# Patient Record
Sex: Female | Born: 1955 | Race: White | Hispanic: No | Marital: Married | Smoking: Former smoker
Health system: Southern US, Community
[De-identification: ages and names within clinical notes are randomized; demographics above are authoritative.]

## PROBLEM LIST (undated history)

## (undated) DIAGNOSIS — I10 Essential (primary) hypertension: Secondary | ICD-10-CM

## (undated) DIAGNOSIS — M810 Age-related osteoporosis without current pathological fracture: Secondary | ICD-10-CM

## (undated) DIAGNOSIS — E785 Hyperlipidemia, unspecified: Secondary | ICD-10-CM

## (undated) DIAGNOSIS — C50919 Malignant neoplasm of unspecified site of unspecified female breast: Secondary | ICD-10-CM

## (undated) DIAGNOSIS — J449 Chronic obstructive pulmonary disease, unspecified: Secondary | ICD-10-CM

## (undated) DIAGNOSIS — C801 Malignant (primary) neoplasm, unspecified: Secondary | ICD-10-CM

## (undated) HISTORY — DX: Malignant neoplasm of unspecified site of unspecified female breast: C50.919

## (undated) HISTORY — DX: Age-related osteoporosis without current pathological fracture: M81.0

## (undated) HISTORY — PX: BREAST LUMPECTOMY: SHX2

## (undated) HISTORY — DX: Essential (primary) hypertension: I10

## (undated) HISTORY — PX: FRACTURE SURGERY: SHX138

## (undated) HISTORY — DX: Malignant (primary) neoplasm, unspecified: C80.1

## (undated) HISTORY — DX: Chronic obstructive pulmonary disease, unspecified: J44.9

## (undated) HISTORY — DX: Hyperlipidemia, unspecified: E78.5

---

## 1998-02-24 ENCOUNTER — Ambulatory Visit (HOSPITAL_COMMUNITY): Admission: RE | Admit: 1998-02-24 | Discharge: 1998-02-24 | Payer: Self-pay | Admitting: Obstetrics & Gynecology

## 1999-03-16 ENCOUNTER — Encounter: Payer: Self-pay | Admitting: Obstetrics & Gynecology

## 1999-03-16 ENCOUNTER — Ambulatory Visit (HOSPITAL_COMMUNITY): Admission: RE | Admit: 1999-03-16 | Discharge: 1999-03-16 | Payer: Self-pay | Admitting: Obstetrics & Gynecology

## 1999-08-07 ENCOUNTER — Encounter: Admission: RE | Admit: 1999-08-07 | Discharge: 1999-08-07 | Payer: Self-pay | Admitting: Family Medicine

## 1999-08-07 ENCOUNTER — Encounter: Payer: Self-pay | Admitting: Family Medicine

## 1999-11-02 ENCOUNTER — Encounter: Payer: Self-pay | Admitting: Family Medicine

## 1999-11-02 ENCOUNTER — Encounter: Admission: RE | Admit: 1999-11-02 | Discharge: 1999-11-02 | Payer: Self-pay | Admitting: Family Medicine

## 1999-11-03 ENCOUNTER — Ambulatory Visit (HOSPITAL_BASED_OUTPATIENT_CLINIC_OR_DEPARTMENT_OTHER): Admission: RE | Admit: 1999-11-03 | Discharge: 1999-11-03 | Payer: Self-pay | Admitting: Orthopedic Surgery

## 2000-02-02 HISTORY — PX: CYSTOSCOPY: SUR368

## 2000-03-25 ENCOUNTER — Ambulatory Visit (HOSPITAL_COMMUNITY): Admission: RE | Admit: 2000-03-25 | Discharge: 2000-03-25 | Payer: Self-pay | Admitting: Obstetrics & Gynecology

## 2000-03-25 ENCOUNTER — Encounter: Payer: Self-pay | Admitting: Obstetrics & Gynecology

## 2001-01-27 ENCOUNTER — Other Ambulatory Visit: Admission: RE | Admit: 2001-01-27 | Discharge: 2001-01-27 | Payer: Self-pay | Admitting: Obstetrics & Gynecology

## 2001-02-27 DIAGNOSIS — M949 Disorder of cartilage, unspecified: Secondary | ICD-10-CM

## 2001-02-27 DIAGNOSIS — M899 Disorder of bone, unspecified: Secondary | ICD-10-CM

## 2001-03-27 ENCOUNTER — Encounter: Payer: Self-pay | Admitting: Obstetrics & Gynecology

## 2001-03-27 ENCOUNTER — Ambulatory Visit (HOSPITAL_COMMUNITY): Admission: RE | Admit: 2001-03-27 | Discharge: 2001-03-27 | Payer: Self-pay | Admitting: Obstetrics & Gynecology

## 2001-08-03 ENCOUNTER — Encounter: Payer: Self-pay | Admitting: Urology

## 2001-08-03 ENCOUNTER — Encounter: Admission: RE | Admit: 2001-08-03 | Discharge: 2001-08-03 | Payer: Self-pay | Admitting: Urology

## 2002-02-21 ENCOUNTER — Other Ambulatory Visit: Admission: RE | Admit: 2002-02-21 | Discharge: 2002-02-21 | Payer: Self-pay | Admitting: Obstetrics & Gynecology

## 2002-11-03 ENCOUNTER — Encounter (INDEPENDENT_AMBULATORY_CARE_PROVIDER_SITE_OTHER): Payer: Self-pay | Admitting: Internal Medicine

## 2002-11-03 LAB — CONVERTED CEMR LAB

## 2003-05-07 ENCOUNTER — Other Ambulatory Visit: Admission: RE | Admit: 2003-05-07 | Discharge: 2003-05-07 | Payer: Self-pay | Admitting: *Deleted

## 2004-02-19 ENCOUNTER — Encounter: Admission: RE | Admit: 2004-02-19 | Discharge: 2004-02-19 | Payer: Self-pay | Admitting: Obstetrics & Gynecology

## 2004-05-08 ENCOUNTER — Encounter: Admission: RE | Admit: 2004-05-08 | Discharge: 2004-05-08 | Payer: Self-pay | Admitting: Obstetrics & Gynecology

## 2004-05-15 ENCOUNTER — Encounter (INDEPENDENT_AMBULATORY_CARE_PROVIDER_SITE_OTHER): Payer: Self-pay | Admitting: *Deleted

## 2004-05-15 ENCOUNTER — Encounter: Admission: RE | Admit: 2004-05-15 | Discharge: 2004-05-15 | Payer: Self-pay | Admitting: Obstetrics & Gynecology

## 2004-05-15 ENCOUNTER — Encounter (INDEPENDENT_AMBULATORY_CARE_PROVIDER_SITE_OTHER): Payer: Self-pay | Admitting: Radiology

## 2004-05-19 ENCOUNTER — Ambulatory Visit: Payer: Self-pay | Admitting: Oncology

## 2004-05-22 ENCOUNTER — Other Ambulatory Visit: Admission: RE | Admit: 2004-05-22 | Discharge: 2004-05-22 | Payer: Self-pay | Admitting: Obstetrics & Gynecology

## 2004-05-22 ENCOUNTER — Encounter: Admission: RE | Admit: 2004-05-22 | Discharge: 2004-05-22 | Payer: Self-pay | Admitting: Surgery

## 2004-08-24 ENCOUNTER — Ambulatory Visit: Admission: RE | Admit: 2004-08-24 | Discharge: 2004-11-22 | Payer: Self-pay | Admitting: Radiation Oncology

## 2004-11-23 ENCOUNTER — Ambulatory Visit: Admission: RE | Admit: 2004-11-23 | Discharge: 2004-12-09 | Payer: Self-pay | Admitting: Radiation Oncology

## 2005-01-13 ENCOUNTER — Ambulatory Visit: Admission: RE | Admit: 2005-01-13 | Discharge: 2005-01-13 | Payer: Self-pay | Admitting: Radiation Oncology

## 2005-05-24 ENCOUNTER — Ambulatory Visit: Payer: Self-pay | Admitting: Family Medicine

## 2007-02-21 ENCOUNTER — Ambulatory Visit: Payer: Self-pay | Admitting: Family Medicine

## 2007-09-05 ENCOUNTER — Telehealth: Payer: Self-pay | Admitting: Family Medicine

## 2007-11-03 LAB — CONVERTED CEMR LAB: Pap Smear: NORMAL

## 2008-04-22 ENCOUNTER — Ambulatory Visit: Payer: Self-pay | Admitting: Family Medicine

## 2008-04-22 LAB — CONVERTED CEMR LAB
Bilirubin Urine: NEGATIVE
Glucose, Urine, Semiquant: NEGATIVE
Ketones, urine, test strip: NEGATIVE
Nitrite: NEGATIVE
Protein, U semiquant: NEGATIVE
Specific Gravity, Urine: 1.01
Urobilinogen, UA: 0.2
WBC Urine, dipstick: NEGATIVE
pH: 6.5

## 2008-04-23 ENCOUNTER — Encounter: Payer: Self-pay | Admitting: Family Medicine

## 2008-05-05 HISTORY — PX: BASAL CELL CARCINOMA EXCISION: SHX1214

## 2008-06-18 ENCOUNTER — Telehealth (INDEPENDENT_AMBULATORY_CARE_PROVIDER_SITE_OTHER): Payer: Self-pay | Admitting: Internal Medicine

## 2008-06-19 ENCOUNTER — Encounter (INDEPENDENT_AMBULATORY_CARE_PROVIDER_SITE_OTHER): Payer: Self-pay | Admitting: Internal Medicine

## 2008-06-19 DIAGNOSIS — Q892 Congenital malformations of other endocrine glands: Secondary | ICD-10-CM

## 2008-06-19 DIAGNOSIS — Z853 Personal history of malignant neoplasm of breast: Secondary | ICD-10-CM

## 2008-07-26 ENCOUNTER — Ambulatory Visit: Payer: Self-pay | Admitting: Internal Medicine

## 2008-07-30 LAB — CONVERTED CEMR LAB
ALT: 21 units/L (ref 0–35)
AST: 25 units/L (ref 0–37)
Albumin: 4.1 g/dL (ref 3.5–5.2)
Alkaline Phosphatase: 57 units/L (ref 39–117)
BUN: 12 mg/dL (ref 6–23)
Basophils Absolute: 0 10*3/uL (ref 0.0–0.1)
Basophils Relative: 0.4 % (ref 0.0–3.0)
Bilirubin, Direct: 0.1 mg/dL (ref 0.0–0.3)
CO2: 29 meq/L (ref 19–32)
Calcium: 9.3 mg/dL (ref 8.4–10.5)
Chloride: 105 meq/L (ref 96–112)
Cholesterol: 158 mg/dL (ref 0–200)
Creatinine, Ser: 0.8 mg/dL (ref 0.4–1.2)
Eosinophils Absolute: 0 10*3/uL (ref 0.0–0.7)
Eosinophils Relative: 0.9 % (ref 0.0–5.0)
GFR calc Af Amer: 97 mL/min
GFR calc non Af Amer: 80 mL/min
Glucose, Bld: 91 mg/dL (ref 70–99)
HCT: 40.9 % (ref 36.0–46.0)
HDL: 65.8 mg/dL (ref >39.0)
Hemoglobin: 14.1 g/dL (ref 12.0–15.0)
LDL Cholesterol: 78 mg/dL (ref 0–99)
Lymphocytes Relative: 26.4 % (ref 12.0–46.0)
MCHC: 34.5 g/dL (ref 30.0–36.0)
MCV: 92.5 fL (ref 78.0–100.0)
Monocytes Absolute: 0.3 10*3/uL (ref 0.1–1.0)
Monocytes Relative: 5.4 % (ref 3.0–12.0)
Neutro Abs: 3.4 10*3/uL (ref 1.4–7.7)
Neutrophils Relative %: 66.9 % (ref 43.0–77.0)
Platelets: 197 10*3/uL (ref 150–400)
Potassium: 3.7 meq/L (ref 3.5–5.1)
RBC: 4.42 M/uL (ref 3.87–5.11)
RDW: 11.9 % (ref 11.5–14.6)
Sodium: 140 meq/L (ref 135–145)
TSH: 0.93 microintl units/mL (ref 0.35–5.50)
Total Bilirubin: 1.1 mg/dL (ref 0.3–1.2)
Total CHOL/HDL Ratio: 2.4
Total Protein: 6.8 g/dL (ref 6.0–8.3)
Triglycerides: 69 mg/dL (ref 0–149)
VLDL: 14 mg/dL (ref 0–40)
Vit D, 1,25-Dihydroxy: 34 (ref 30–89)
WBC: 5 10*3/uL (ref 4.5–10.5)

## 2008-08-02 ENCOUNTER — Ambulatory Visit: Payer: Self-pay | Admitting: Family Medicine

## 2008-08-02 ENCOUNTER — Encounter (INDEPENDENT_AMBULATORY_CARE_PROVIDER_SITE_OTHER): Payer: Self-pay | Admitting: Internal Medicine

## 2008-08-02 DIAGNOSIS — E559 Vitamin D deficiency, unspecified: Secondary | ICD-10-CM | POA: Insufficient documentation

## 2008-08-02 DIAGNOSIS — A6000 Herpesviral infection of urogenital system, unspecified: Secondary | ICD-10-CM | POA: Insufficient documentation

## 2008-08-02 DIAGNOSIS — R030 Elevated blood-pressure reading, without diagnosis of hypertension: Secondary | ICD-10-CM

## 2008-08-06 LAB — CONVERTED CEMR LAB: Herpes Simplex Vrs I&II-IgM Ab (EIA): 0.87

## 2008-09-06 ENCOUNTER — Ambulatory Visit: Payer: Self-pay | Admitting: Family Medicine

## 2008-09-06 DIAGNOSIS — I10 Essential (primary) hypertension: Secondary | ICD-10-CM | POA: Insufficient documentation

## 2008-10-08 ENCOUNTER — Encounter: Payer: Self-pay | Admitting: Family Medicine

## 2008-10-11 ENCOUNTER — Ambulatory Visit: Payer: Self-pay | Admitting: Family Medicine

## 2008-10-11 DIAGNOSIS — J309 Allergic rhinitis, unspecified: Secondary | ICD-10-CM | POA: Insufficient documentation

## 2008-10-28 ENCOUNTER — Ambulatory Visit: Payer: Self-pay | Admitting: Family Medicine

## 2008-10-28 DIAGNOSIS — M79609 Pain in unspecified limb: Secondary | ICD-10-CM

## 2008-10-28 DIAGNOSIS — H60339 Swimmer's ear, unspecified ear: Secondary | ICD-10-CM

## 2008-10-28 DIAGNOSIS — L989 Disorder of the skin and subcutaneous tissue, unspecified: Secondary | ICD-10-CM | POA: Insufficient documentation

## 2008-10-28 DIAGNOSIS — S92919A Unspecified fracture of unspecified toe(s), initial encounter for closed fracture: Secondary | ICD-10-CM | POA: Insufficient documentation

## 2008-12-06 ENCOUNTER — Ambulatory Visit: Payer: Self-pay | Admitting: Family Medicine

## 2008-12-06 DIAGNOSIS — R059 Cough, unspecified: Secondary | ICD-10-CM | POA: Insufficient documentation

## 2008-12-06 DIAGNOSIS — R05 Cough: Secondary | ICD-10-CM | POA: Insufficient documentation

## 2008-12-17 ENCOUNTER — Telehealth (INDEPENDENT_AMBULATORY_CARE_PROVIDER_SITE_OTHER): Payer: Self-pay | Admitting: *Deleted

## 2009-02-21 ENCOUNTER — Ambulatory Visit: Payer: Self-pay | Admitting: Family Medicine

## 2009-08-13 LAB — CONVERTED CEMR LAB: Pap Smear: NORMAL

## 2009-09-12 ENCOUNTER — Ambulatory Visit: Payer: Self-pay | Admitting: Family Medicine

## 2009-10-09 ENCOUNTER — Telehealth: Payer: Self-pay | Admitting: Family Medicine

## 2009-10-15 ENCOUNTER — Telehealth: Payer: Self-pay | Admitting: Family Medicine

## 2010-04-28 ENCOUNTER — Ambulatory Visit: Payer: Self-pay | Admitting: Family Medicine

## 2010-04-28 LAB — CONVERTED CEMR LAB: Vit D, 25-Hydroxy: 92 ng/mL — ABNORMAL HIGH (ref 30–89)

## 2010-04-29 ENCOUNTER — Encounter: Payer: Self-pay | Admitting: Family Medicine

## 2010-04-29 LAB — CONVERTED CEMR LAB
BUN: 16 mg/dL (ref 6–23)
CO2: 31 meq/L (ref 19–32)
Calcium: 9.5 mg/dL (ref 8.4–10.5)
Chloride: 101 meq/L (ref 96–112)
Cholesterol: 211 mg/dL — ABNORMAL HIGH (ref 0–200)
Creatinine, Ser: 0.9 mg/dL (ref 0.4–1.2)
Direct LDL: 114.6 mg/dL
GFR calc non Af Amer: 66.9 mL/min (ref >60)
Glucose, Bld: 88 mg/dL (ref 70–99)
HDL: 65.1 mg/dL (ref >39.00)
Potassium: 3.6 meq/L (ref 3.5–5.1)
Sodium: 140 meq/L (ref 135–145)
Total CHOL/HDL Ratio: 3
Triglycerides: 127 mg/dL (ref 0.0–149.0)
VLDL: 25.4 mg/dL (ref 0.0–40.0)

## 2010-05-11 ENCOUNTER — Ambulatory Visit: Payer: Self-pay | Admitting: Psychology

## 2010-06-01 ENCOUNTER — Ambulatory Visit: Payer: Self-pay | Admitting: Psychology

## 2010-06-25 ENCOUNTER — Ambulatory Visit: Payer: Self-pay | Admitting: Family Medicine

## 2010-06-25 ENCOUNTER — Ambulatory Visit: Payer: Self-pay | Admitting: Internal Medicine

## 2010-06-25 DIAGNOSIS — N926 Irregular menstruation, unspecified: Secondary | ICD-10-CM | POA: Insufficient documentation

## 2010-06-25 LAB — CONVERTED CEMR LAB
Bilirubin Urine: NEGATIVE
Glucose, Urine, Semiquant: NEGATIVE
Ketones, urine, test strip: NEGATIVE
Nitrite: NEGATIVE
Specific Gravity, Urine: 1.01
Urobilinogen, UA: 0.2
WBC Urine, dipstick: NEGATIVE
pH: 6

## 2010-06-26 ENCOUNTER — Encounter: Payer: Self-pay | Admitting: Family Medicine

## 2010-08-06 NOTE — Assessment & Plan Note (Signed)
Summary: cpx/alc  wants labs same day   Vital Signs:  Patient profile:   55 year old female Height:      62 inches Weight:      136 pounds BMI:     24.96 Temp:     98.3 degrees F oral Pulse rate:   72 / minute Pulse rhythm:   regular BP sitting:   120 / 80  (right arm) Cuff size:   regular  Vitals Entered By: Linde Gillis CMA [facility]) (April 28, 2010 8:24 AM) CC: complete physicial, no pap, no flu shot   History of Present Illness: 55 yo here for CPX.  Breast CA s/p radiation, tamoxifen. Doing well.  Stopped taking Tamoxifen in June and released by her onocologist last month. UTD on mammogram.  HTN- much improved on HCTZ 25 mg daily.  No CP, SOB, blurred vision or dizziness.  No LE edema.  Well woman- UTD on prevention Has OBGYN.  Current Medications (verified): 1)  Tamoxifen Citrate 20 Mg  Tabs (Tamoxifen Citrate) .... One By Mouth Once A Day 2)  Calcium-D 600-200 Mg-Unit Caps (Calcium Carbonate-Vitamin D) .... Take One By Mouth Daily 3)  Multivitamins  Tabs (Multiple Vitamin) .... Take One By Mouth Daily 4)  Vitamin D 1000 Unit Caps (Cholecalciferol) .Marland Kitchen.. 1daily By Mouth 5)  Hydrochlorothiazide 25 Mg  Tabs (Hydrochlorothiazide) .... Take 1 Tab By Mouth Every Morning  Allergies (verified): No Known Drug Allergies  Past History:  Past Medical History: Last updated: 12/06/2008 Breast cancer, hx of HTN  Past Surgical History: Last updated: 08/02/2008 Dexa Osteopenia/ spine-- 02/07/01 Renal U/S-- wnl-- 08/07/99 Cystoscopy mild urethral trigonitis-- 02/02/00 removal of basal cell L eyelid 11/09 colonoscopy --5/09--  Family History: Last updated: 08/02/2008 Father: Alive, with a heart attack at age 30,  high blood pressure Mother: Alive with Grave's disease, HTN and alcoholism Siblings: Sister died at age 39 of breast cancer, dx at age 38.               One sister alive--HBP 07/2008 Paternal grandparents both had heart attacks and died from them. Paternal  grandmother, went on to renal failure Maternal grandmother had breast cancer--age 2 CV + Father, PGF deceased at 35, MGF MI HBP + Bother parents DM - none Breast cancer + sister and MGM ETOH + Mother Stroke - none  Social History: Last updated: 06/19/2008 Marital Status: Married Children: One Occupation: Emergency planning/management officer for [employer]  Risk Factors: Alcohol Use: <1 (08/02/2008) Caffeine Use: 3 (08/02/2008) Exercise: yes (08/02/2008)  Risk Factors: Smoking Status: quit (06/19/2008) Passive Smoke Exposure: yes (08/02/2008)  Review of Systems      See HPI General:  Denies malaise. Eyes:  Denies blurring. ENT:  Denies difficulty swallowing. CV:  Denies chest pain or discomfort. Resp:  Denies shortness of breath. GI:  Denies abdominal pain, bloody stools, and change in bowel habits. GU:  Denies abnormal vaginal bleeding. MS:  Denies joint pain, joint redness, and joint swelling. Neuro:  Denies headaches. Psych:  Denies anxiety and depression. Endo:  Denies cold intolerance and heat intolerance. Heme:  Denies abnormal bruising.  Physical Exam  General:  Well-developed,well-nourished,in no acute distress; alert,appropriate and cooperative throughout examination Head:  Normocephalic and atraumatic without obvious abnormalities. No apparent alopecia or balding. Eyes:  No corneal or conjunctival inflammation noted. EOMI. Perrla. Funduscopic exam benign, without hemorrhages, exudates or papilledema. Vision grossly normal. Ears:  no external deformities.   Nose:  no external deformity.   Mouth:  good dentition.  Neck:  No deformities, masses, or tenderness noted. Lungs:  Normal respiratory effort, chest expands symmetrically. Lungs are clear to auscultation, no crackles or wheezes. Heart:  Normal rate and regular rhythm. S1 and S2 normal without gallop, murmur, click, rub or other extra sounds. Abdomen:  Bowel sounds positive,abdomen soft and non-tender without masses, organomegaly  or hernias noted. Msk:  No deformity or scoliosis noted of thoracic or lumbar spine.   Extremities:  no edema Neurologic:  alert & oriented X3 and gait normal.   Skin:  Intact without suspicious lesions or rashes Psych:  Cognition and judgment appear intact. Alert and cooperative with normal attention span and concentration. No apparent delusions, illusions, hallucinations   Impression & Recommendations:  Problem # 1:  ROUTINE GENERAL MEDICAL EXAM@[email or web address]  CARE FACL (ICD-V70.0) Reviewed preventive care protocols, scheduled due services, and updated immunizations Discussed nutrition, exercise, diet, and healthy lifestyle.  Doing well. FLP, BMET today. UTD on all other prevention.  Problem # 2:  HYPERTENSION (ICD-401.9) Assessment: Improved Continue HCTZ 25 mg daily. Her updated medication list for this problem includes:    Hydrochlorothiazide 25 Mg Tabs (Hydrochlorothiazide) .Marland Kitchen... Take 1 tab by mouth every morning  Orders: TLB-BMP (Basic Metabolic Panel-BMET) (80048-METABOL)  Complete Medication List: 1)  Tamoxifen Citrate 20 Mg Tabs (Tamoxifen citrate) .... One by mouth once a day 2)  Calcium-d 600-200 Mg-unit Caps (Calcium carbonate-vitamin d) .... Take one by mouth daily 3)  Multivitamins Tabs (Multiple vitamin) .... Take one by mouth daily 4)  Vitamin D 1000 Unit Caps (Cholecalciferol) .Marland Kitchen.. 1daily by mouth 5)  Hydrochlorothiazide 25 Mg Tabs (Hydrochlorothiazide) .... Take 1 tab by mouth every morning  Other Orders: Venipuncture (09811) TLB-Lipid Panel (80061-LIPID) T-Vitamin D (25-Hydroxy) (91478-29562)   Orders Added: 1)  Venipuncture [13086] 2)  TLB-Lipid Panel [80061-LIPID] 3)  TLB-BMP (Basic Metabolic Panel-BMET) [80048-METABOL] 4)  T-Vitamin D (25-Hydroxy) [57846-96295] 5)  Est. Patient 40-64 years [28413]    Current Allergies (reviewed today): No known allergies   Last PAP:  normal (11/03/2007 1:28:08 PM) PAP Result Date:  08/13/2009 PAP Result:   normal Last Mammogram:  Done (09/03/2002 4:49:09 PM) Mammogram Result Date:  08/13/2009 Mammogram Result:  normal

## 2010-08-06 NOTE — Progress Notes (Signed)
Summary: regarding BP  Phone Note Call from Patient   Caller: Patient Call For: Ruthe Mannan MD Summary of Call: Pt called to report BP readings- 150/83, 161/83, 138/87.  She says this is not coming down, she has been getting headaches.  She is taking two hctz daily now.  She left a message and when I called her back to get more info I got her voice mail. Initial call taken by: Lowella Petties CMA,  October 09, 2009 3:21 PM  Follow-up for Phone Call        Yes I would agree that she needs to take two daily.  If still this high taking two, needs to come in to be seen. Follow-up by: Ruthe Mannan MD,  October 09, 2009 3:27 PM  Additional Follow-up for Phone Call Additional follow up Details #1::        Left message on voicemail  in detail.  Personalized VM.  Additional Follow-up by: Delilah Shan CMA [facility]),  October 09, 2009 4:09 PM

## 2010-08-06 NOTE — Letter (Signed)
Summary: Generic Letter  [facility]  [address] [place], [place] [address]   Phone: [phone]  Fax: [phone]    04/29/2010  Kristen Soto [address] [place], [place]  [address]  Dear [name],   We have received your lab results and Dr. Dayton Martes says that your labs are excellent but your Vitamin D is very high.  Please stop taking Vitamin D supplementation (ok to continue calcium with vitamin d tablets). Enclosed is a copy of your lab results.        Sincerely,       Linde Gillis CMA (AAMA)for Dr. Ruthe Mannan

## 2010-08-06 NOTE — Assessment & Plan Note (Signed)
Summary: UTI?  CYD   Vital Signs:  Patient profile:   55 year old female Height:      62 inches Weight:      140.75 pounds BMI:     25.84 Temp:     98.7 degrees F oral Pulse rate:   72 / minute Pulse rhythm:   regular BP sitting:   118 / 76  (left arm) Cuff size:   regular  Vitals Entered By: Lewanda Rife LPN (June 25, 2010 12:41 PM) CC: ?UTI burn when urinate, finishing menstrual cycle light bleeding   History of Present Illness: CC: UTI?  4-5 d h/o dysuria.  + urgency, polyuria, incomplete emptying.  No sxs today.  h/o UTIs in past ,but not recently.  Pushing fluids.  No fevers/chills, no abd pain, n/v, back pain.  No periods for 6 years (BRCA chemo then tamoxifen).  Stopped tamoxifen in June.  1st period 2 wkends ago - heavy bleeding initially then slowed down, then another episode of bleeding this week, light now.  both times bleeding after sex.  no dyspareunia  s/p BRCA treatment.  doing well from this aspect.  Followed by Dr. Avis Epley at [facility].  On SOFT trial.  last well woman exam was 08/2009.  due coming in February or March.  strong family history of ovarian cancer.  Current Medications (verified): 1)  Calcium-D 600-200 Mg-Unit Caps (Calcium Carbonate-Vitamin D) .... Take One By Mouth Daily 2)  Multivitamins  Tabs (Multiple Vitamin) .... Take One By Mouth Daily 3)  Vitamin D 1000 Unit Caps (Cholecalciferol) .Marland Kitchen.. 1daily By Mouth Three Times A Week 4)  Hydrochlorothiazide 25 Mg  Tabs (Hydrochlorothiazide) .... Take 1 Tab By Mouth Every Morning  Allergies (verified): No Known Drug Allergies  Past History:  Past Medical History: Last updated: 12/06/2008 Breast cancer, hx of HTN  Social History: Last updated: 06/19/2008 Marital Status: Married Children: One Occupation: Emergency planning/management officer for [employer]  Review of Systems       per HPI  Physical Exam  General:  Well-developed,well-nourished,in no acute distress; alert,appropriate and cooperative throughout  examination Abdomen:  Bowel sounds positive,abdomen soft and non-tender without masses, organomegaly or hernias noted.  no CVA tenderness.  + suprapubic pressure.  no masses appreciated Pulses:  2+ rad pulses Extremities:  no edema   Impression & Recommendations:  Problem # 1:  ? of UTI (ICD-599.0) Assessment New UA not consistent with UTI.  very dilute.  sent culture.  Encouraged to push clear liquids, get enough rest, and take acetaminophen as needed. To be seen in 10 days if no improvement, sooner if worse.  Her updated medication list for this problem includes:    Cipro 250 Mg Tabs (Ciprofloxacin hcl) .Marland Kitchen... Take one twice daily for 3 days  Orders: UA Dipstick W/ Micro (manual) (16109) Specimen Handling (99000) T-Culture, Urine ([address]-09811)  Problem # 2:  IRREGULAR MENSES (ICD-626.4) Assessment: New s/p BRCA and tamoxifen treatment, completed 12/2009.  New bleeding last 1-2 wks, advised if continued will need evaluation.  both times after sex.  pt states if continued, will f/u with [facility], due for well woman with them.  advised to call and schedule appt.  Complete Medication List: 1)  Calcium-d 600-200 Mg-unit Caps (Calcium carbonate-vitamin d) .... Take one by mouth daily 2)  Multivitamins Tabs (Multiple vitamin) .... Take one by mouth daily 3)  Vitamin D 1000 Unit Caps (Cholecalciferol) .Marland Kitchen.. 1daily by mouth three times a week 4)  Hydrochlorothiazide 25 Mg Tabs (Hydrochlorothiazide) .... Take  1 tab by mouth every morning 5)  Cipro 250 Mg Tabs (Ciprofloxacin hcl) .... Take one twice daily for 3 days  Patient Instructions: 1)  Doesn't look like infection.  culture sent to be sure. 2)  Push fluids, cranberry juice. 3)  If bleeding continues, would recommend pelvic exam. Prescriptions: CIPRO 250 MG TABS (CIPROFLOXACIN HCL) take one twice daily for 3 days  #6 x 0   Entered and Authorized by:   Eustaquio Boyden  MD   Signed by:   Eustaquio Boyden  MD on 06/25/2010   Method used:    Electronically to        [facility]  [address] [id number] (retail)       [address]       [place], [place]  [address]       Ph: [phone] or [phone]       Fax: [phone]   RxID:   [id number] HYDROCHLOROTHIAZIDE 25 MG  TABS (HYDROCHLOROTHIAZIDE) Take 1 tab by mouth every morning  #90 x 3   Entered and Authorized by:   Eustaquio Boyden  MD   Signed by:   Eustaquio Boyden  MD on 06/25/2010   Method used:   Print then Give to Patient   RxID:   [id number]    Orders Added: 1)  Est. Patient Level III [35573] 2)  UA Dipstick W/ Micro (manual) [81000] 3)  Specimen Handling [99000] 4)  T-Culture, Urine [[id number]]    Current Allergies (reviewed today): No known allergies   Laboratory Results   Urine Tests  Date/Time Received: June 25, 2010 12:43 PM  Date/Time Reported: June 25, 2010 12:43 PM   Routine Urinalysis   Color: yellow Appearance: slight hazy Glucose: negative   (Normal Range: Negative) Bilirubin: negative   (Normal Range: Negative) Ketone: negative   (Normal Range: Negative) Spec. Gravity: 1.010   (Normal Range: 1.003-1.035) Blood: small   (Normal Range: Negative) pH: 6.0   (Normal Range: 5.0-8.0) Protein: trace   (Normal Range: Negative) Urobilinogen: 0.2   (Normal Range: 0-1) Nitrite: negative   (Normal Range: Negative) Leukocyte Esterace: negative   (Normal Range: Negative)    Comments: Pt finishing menstrual period.     Appended Document: UTI?  CYD late entry   Clinical Lists Changes  Observations: Added new observation of COMMENTS: read by .........Eustaquio Boyden  MD  June 25, 2010 5:08 PM  UCx sent. (06/25/2010 17:08) Added new observation of UA YEAST: no (06/25/2010 17:08) Added new observation of CASTS URINE: no (06/25/2010 17:08) Added new observation of URINE CRYSTA: no (06/25/2010 17:08) Added new observation of EPI CELL UR: rare (06/25/2010 17:08) Added new observation of MUCUS URINE: no (06/25/2010  17:08) Added new observation of BACTERIA URN: tr (06/25/2010 17:08) Added new observation of RBCS MICRO U: rare (06/25/2010 17:08) Added new observation of WBCS MICRO U: rare (06/25/2010 17:08)      Laboratory Results   Urine Tests    Urine Microscopic WBC/HPF: rare RBC/HPF: rare Bacteria/HPF: tr Mucous/HPF: no Epithelial/HPF: rare Crystals/HPF: no Casts/LPF: no Yeast/HPF: no    Comments: read by .........Eustaquio Boyden  MD  June 25, 2010 5:08 PM  UCx sent.    Appended Document: UTI?  CYD please notify patient small amt bacteria in culture, sensitive to abx used.  should be feeling better.  Appended Document: UTI?  CYD Patient notified and she is feeling much better.

## 2010-08-06 NOTE — Progress Notes (Signed)
Summary: regarding hctz  Phone Note Call from Patient   Caller: Patient Call For: Ruthe Mannan MD Summary of Call: Pt called to report BP readings- 125/80, 125/75, 116/75, 145/80.  She has been taking 2 hctz 12.5 mg's daily but she is asking if a script for 25 mg's, to take one a day, can be sent to [facility]. Initial call taken by: Lowella Petties CMA,  October 15, 2009 12:24 PM    New/Updated Medications: HYDROCHLOROTHIAZIDE 25 MG  TABS (HYDROCHLOROTHIAZIDE) Take 1 tab by mouth every morning Prescriptions: HYDROCHLOROTHIAZIDE 25 MG  TABS (HYDROCHLOROTHIAZIDE) Take 1 tab by mouth every morning  #90 x 3   Entered and Authorized by:   Ruthe Mannan MD   Signed by:   Ruthe Mannan MD on 10/15/2009   Method used:   Electronically to        [facility]* (mail-order)             ,          Ph: [phone]       Fax: [phone]   RxID:   [id number]

## 2010-08-06 NOTE — Assessment & Plan Note (Signed)
Summary: 30 min appt1 month follow up,Kristen Soto's pt   Vital Signs:  Patient profile:   55 year old female Height:      62 inches Weight:      140.13 pounds BMI:     25.72 Temp:     97.6 degrees F oral Pulse rate:   88 / minute Pulse rhythm:   regular BP sitting:   132 / 72  (left arm) Cuff size:   regular  Vitals Entered By: Delilah Shan CMA [facility]) (September 12, 2009 3:45 PM) CC: 30 minute appointment      1 month follow up   History of Present Illness: 55 yo new to me here for follow up of HTN. Currently taking HCTZ 12.5 mg with no side effects.  Had to stop Lisinopril on 12/06/2008 due to cough.  No CP, SOB, blurred vision.   No LE edema. Was at [facility] office last week and BP was 142/82.     Current Medications (verified): 1)  Tamoxifen Citrate 20 Mg  Tabs (Tamoxifen Citrate) .... One By Mouth Once A Day 2)  Calcium-D 600-200 Mg-Unit Caps (Calcium Carbonate-Vitamin D) .... Take One By Mouth Daily 3)  Multivitamins  Tabs (Multiple Vitamin) .... Take One By Mouth Daily 4)  Vitamin D 1000 Unit Caps (Cholecalciferol) .Marland Kitchen.. 1daily By Mouth 5)  Hydrochlorothiazide 12.5 Mg Caps (Hydrochlorothiazide) .Marland Kitchen.. 1 By Mouth Daily  Allergies (verified): No Known Drug Allergies  Family History: Reviewed history from 08/02/2008 and no changes required. Father: Alive, with a heart attack at age 81,  high blood pressure Mother: Alive with Grave's disease, HTN and alcoholism Siblings: Sister died at age 24 of breast cancer, dx at age 5.               One sister alive--HBP 07/2008 Paternal grandparents both had heart attacks and died from them. Paternal grandmother, went on to renal failure Maternal grandmother had breast cancer--age 23 CV + Father, PGF deceased at 62, MGF MI HBP + Bother parents DM - none Breast cancer + sister and MGM ETOH + Mother Stroke - none  Social History: Reviewed history from 06/19/2008 and no changes required. Marital Status: Married Children: One Occupation:  Emergency planning/management officer for [employer]  Review of Systems      See HPI Eyes:  Denies blurring. ENT:  Denies difficulty swallowing. CV:  Denies chest pain or discomfort.  Physical Exam  General:  Well-developed,well-nourished,in no acute distress; alert,appropriate and cooperative throughout examination Eyes:  No corneal or conjunctival inflammation noted. EOMI. Perrla. Funduscopic exam benign, without hemorrhages, exudates or papilledema. Vision grossly normal. Lungs:  Normal respiratory effort, chest expands symmetrically. Lungs are clear to auscultation, no crackles or wheezes. Heart:  Normal rate and regular rhythm. S1 and S2 normal without gallop, murmur, click, rub or other extra sounds. Extremities:  no edema. Psych:  Cognition and judgment appear intact. Alert and cooperative with normal attention span and concentration. No apparent delusions, illusions, hallucinations   Impression & Recommendations:  Problem # 1:  HYPERTENSION (ICD-401.9) Assessment Unchanged Still at goal.  Given that she had an elevated reading last week, advised to check at home three times weekly for next two weeks and give me a call.  Work has been more stressful lat couple of weeks. Continue current dose of HCTZ. Her updated medication list for this problem includes:    Hydrochlorothiazide 12.5 Mg Caps (Hydrochlorothiazide) .Marland Kitchen... 1 by mouth daily  Complete Medication List: 1)  Tamoxifen Citrate 20 Mg Tabs (Tamoxifen citrate) .Marland KitchenMarland KitchenMarland Kitchen  One by mouth once a day 2)  Calcium-d 600-200 Mg-unit Caps (Calcium carbonate-vitamin d) .... Take one by mouth daily 3)  Multivitamins Tabs (Multiple vitamin) .... Take one by mouth daily 4)  Vitamin D 1000 Unit Caps (Cholecalciferol) .Marland Kitchen.. 1daily by mouth 5)  Hydrochlorothiazide 12.5 Mg Caps (Hydrochlorothiazide) .Marland Kitchen.. 1 by mouth daily  Current Allergies (reviewed today): No known allergies

## 2010-11-05 ENCOUNTER — Encounter: Payer: Self-pay | Admitting: Family Medicine

## 2010-11-05 LAB — HM COLONOSCOPY

## 2010-11-05 LAB — HM MAMMOGRAPHY

## 2010-11-09 ENCOUNTER — Encounter: Payer: Self-pay | Admitting: Family Medicine

## 2010-11-09 ENCOUNTER — Ambulatory Visit (INDEPENDENT_AMBULATORY_CARE_PROVIDER_SITE_OTHER): Payer: 59 | Admitting: Family Medicine

## 2010-11-09 ENCOUNTER — Ambulatory Visit
Admission: RE | Admit: 2010-11-09 | Discharge: 2010-11-09 | Disposition: A | Discharge status: Home / Self Care | Payer: 59 | Source: Ambulatory Visit | Attending: Family Medicine | Admitting: Family Medicine

## 2010-11-09 ENCOUNTER — Other Ambulatory Visit: Payer: Self-pay | Admitting: Family Medicine

## 2010-11-09 VITALS — BP 110/80 | HR 60 | Temp 98.6°F | Ht 62.0 in | Wt 135.1 lb

## 2010-11-09 DIAGNOSIS — R102 Pelvic and perineal pain: Secondary | ICD-10-CM

## 2010-11-09 DIAGNOSIS — R509 Fever, unspecified: Secondary | ICD-10-CM | POA: Insufficient documentation

## 2010-11-09 DIAGNOSIS — N949 Unspecified condition associated with female genital organs and menstrual cycle: Secondary | ICD-10-CM

## 2010-11-09 LAB — CBC WITH DIFFERENTIAL/PLATELET
Basophils Absolute: 0 10*3/uL (ref 0.0–0.1)
Basophils Relative: 0.3 % (ref 0.0–3.0)
Eosinophils Absolute: 0 10*3/uL (ref 0.0–0.7)
Eosinophils Relative: 0.3 % (ref 0.0–5.0)
HCT: 35.2 % — ABNORMAL LOW (ref 36.0–46.0)
Hemoglobin: 12.3 g/dL (ref 12.0–15.0)
Lymphocytes Relative: 10.5 % — ABNORMAL LOW (ref 12.0–46.0)
Lymphs Abs: 0.8 10*3/uL (ref 0.7–4.0)
MCHC: 35 g/dL (ref 30.0–36.0)
MCV: 91.1 fl (ref 78.0–100.0)
Monocytes Absolute: 0.4 10*3/uL (ref 0.1–1.0)
Monocytes Relative: 5.1 % (ref 3.0–12.0)
Neutrophils Relative %: 83.8 % — ABNORMAL HIGH (ref 43.0–77.0)
Platelets: 305 10*3/uL (ref 150.0–400.0)
RBC: 3.86 Mil/uL — ABNORMAL LOW (ref 3.87–5.11)
RDW: 12.5 % (ref 11.5–14.6)

## 2010-11-09 LAB — POCT URINALYSIS DIPSTICK
Bilirubin, UA: NEGATIVE
Glucose, UA: NEGATIVE
Ketones, UA: NEGATIVE
Leukocytes, UA: NEGATIVE
Nitrite, UA: NEGATIVE
Protein, UA: NEGATIVE
Spec Grav, UA: 1.005
pH, UA: 7.5

## 2010-11-09 LAB — HEPATIC FUNCTION PANEL
ALT: 15 U/L (ref 0–35)
AST: 18 U/L (ref 0–37)
Albumin: 3.4 g/dL — ABNORMAL LOW (ref 3.5–5.2)
Alkaline Phosphatase: 76 U/L (ref 39–117)
Bilirubin, Direct: 0.1 mg/dL (ref 0.0–0.3)
Total Bilirubin: 0.6 mg/dL (ref 0.3–1.2)
Total Protein: 6.5 g/dL (ref 6.0–8.3)

## 2010-11-09 LAB — BASIC METABOLIC PANEL
BUN: 12 mg/dL (ref 6–23)
CO2: 28 mEq/L (ref 19–32)
Chloride: 102 mEq/L (ref 96–112)
Potassium: 3.7 mEq/L (ref 3.5–5.1)

## 2010-11-09 MED ORDER — MOXIFLOXACIN HCL 400 MG PO TABS: 400.0000 mg | ORAL_TABLET | Freq: Every day | ORAL | Status: AC

## 2010-11-09 NOTE — Progress Notes (Signed)
55 yo here for fever for almost two weeks.  [place] onset of fever, Tmax 103. Had sharp bilateral low back pain. Went to Urgent care facility there. Does not have records with her but she wrote down what she was given and told by MD. Told her UA and culture were neg for infection. CBC showed elevated white count with left shift.  Given IM rocephin.  Per pt, fevers have decreased but every evening, getting low grade temp around 100. Back pain improved. No dysuria. Still does not feel quite right.  Has h/o breast CA s/p radiation, tamoxifen. Doing well.  Stopped taking Tamoxifen in June and released by her onocologist last month.  Has been having irregular heavy periods. S/p endometrial biopsy several months ago at [facility] that was neg.  UTD on mammogram. Colonoscopy neg approx 4 years ago. No blood in stool.   The PMH, PSH, Social History, Family History, Medications, and allergies have been reviewed in [facility], and have been updated if relevant.  ROS: See HPI Patient reports no vision/ hearing  changes, adenopathy, weight change,  persistant / recurrent hoarseness , swallowing issues, chest pain,palpitations,edema,persistant /recurrent cough, hemoptysis, dyspnea( rest/ exertional/paroxysmal nocturnal), gastrointestinal bleeding(melena, rectal bleeding), abdominal pain, significant heartburn boel changes,GU symptoms(dysuria, hematuria,pyuria, incontinence) ),  syncope, focal weakness, memory loss,numbness & tingling, skin/hair /nail changes,abnormal bruising or bleeding, anxiety,or depression.   Physical Exam BP 110/80  Pulse 60  Temp(Src) 98.6 F (37 C) (Oral)  Ht 5\' 2"  (1.575 m)  Wt 135 lb 1.9 oz (61.29 kg)  BMI 24.71 kg/m2  LMP 10/29/2010  General:  Well-developed,well-nourished,in no acute distress; alert,appropriate and cooperative throughout examination Head:  Normocephalic and atraumatic without obvious abnormalities. No apparent alopecia  or balding. Eyes:  No corneal or conjunctival inflammation noted. EOMI. Perrla. Funduscopic exam benign, without hemorrhages, exudates or papilledema. Vision grossly normal. Ears:  no external deformities.   Nose:  no external deformity.   Mouth:  good dentition.   Neck:  No deformities, masses, or tenderness noted. Lungs:  Normal respiratory effort, chest expands symmetrically. Lungs are clear to auscultation, no crackles or wheezes. Heart:  Normal rate and regular rhythm. S1 and S2 normal without gallop, murmur, click, rub or other extra sounds. Abdomen:  Bowel sounds positive,abdomen soft and non-tender without masses, organomegaly or hernias noted. Msk:  No deformity or scoliosis noted of thoracic or lumbar spine.   Extremities:  no edema Neurologic:  alert & oriented X3 and gait normal.   Skin:  Intact without suspicious lesions or rashes Psych:  Cognition and judgment appear intact. Alert and cooperative with normal attention span and concentration. No apparent delusions, illusions, hallucinations

## 2010-11-09 NOTE — Patient Instructions (Signed)
Please stop by to see Kristen Soto on your way out. 

## 2010-11-09 NOTE — Assessment & Plan Note (Signed)
New. Etiology unknown but differential diagnosis is wide. UA here pos for blood only, will send for culture.  She is having menstrual bleeding but not very heavy today.  Blood could be from menstrual bleeding, ?possibly of kidney stone as well. History is most consistent with pyleonephritis but had neg UA and urine culture at urgent care according to pt.  Awaiting urine culture here. I am also concerned about pelvic pathology given her h/o breast CA s/p tamoxifen.  Will get pelvic U/S and treat empirically for intra pelvic/kidney infection with Avelox. Will also order blood work today: Orders Placed This Encounter  . CBC w/Diff  . Basic Metabolic Panel (BMET)  . Hepatic function panel   IFOB as well.

## 2010-11-10 LAB — URINE CULTURE
Colony Count: NO GROWTH
Organism ID, Bacteria: NO GROWTH

## 2010-11-20 NOTE — Op Note (Signed)
[place]  Patient:    Kristen Soto, Kristen Soto                       MRN: [id number] Proc. Date: 11/03/99 Adm. Date:  [id number] Disc. Date: [id number] Attending:  Sypher, Douglass Rivers CC:         Katy Fitch. Sypher, Montez Hageman., M.D. x 2                           Operative Report  PREOPERATIVE DIAGNOSIS:  Intra-articular fracture, right ring finger proximal phalanx at proximal interphalangeal joint.  POSTOPERATIVE DIAGNOSIS:  Intra-articular fracture, right ring finger proximal phalanx at proximal interphalangeal joint.  OPERATION:  Closed reduction and percutaneous Kirschner wire fixation x 3 of right ring proximal phalangeal intra-articular fracture.  SURGEON:  Katy Fitch. Sypher, Montez Hageman., M.D.  ASSISTANT:  None.  ANESTHESIA:  General LMA.  SUPERVISING ANESTHESIOLOGIST:  Halford Decamp, M.D.  INDICATIONS:  The patient is a 55 year old woman who was wrestling with her dog and accidentally got her finger caught in the collar.  She sustained an ulnar deviation injury to her ring finger, sustaining intra-articular fracture of the proximal phalanx.  She was seen at her family physicians office.  X-rays were obtained and she was referred for a hand surgery consult.  After informed consent, she was brought to the operating room at this time for closed reduction of her displaced fracture and percutaneous K-wire fixation, __  open reduction and internal fixation.  DESCRIPTION OF PROCEDURE:  The patient was brought to the operating room and placed in the supine position upon the operating table.  Following induction of general anesthesia by LMA, the right arm was prepped with Betadine soap and solution, and sterilely draped.  With the aid of the C-arm fluoroscope, the fracture was reduced anatomically with closed reduction with three point molding and use of a towel clip.  Three 0.035 inch Kirschner wires were placed subcutaneously.   Satisfactory reduction was achieved.  Thereafter, the hand was placed in a ________ gauze dressing with individual gauze pads between the fingers, and a derotation wrap around the ring finger to prevent recurrence of the deformity.  Dorsal palmar plaster ________ splints were placed, maintaining the hand in a safe position.  The patient was awakened from anesthesia and transferred to the recovery room with stable vital signs.  She will be discharged to the care of her husband with prescriptions for Motrin 600 mg one p.o. q.6h. p.r.n. pain, also Percocet 5/325 one or two tablets p.o. q.4-6h. p.r.n. pain.  She will return to the office and will follow up in six days for reevaluation x-ray and placement in a dorsal splint. DD:  11/03/99 TD:  11/05/99 Job: [id number]

## 2010-11-25 ENCOUNTER — Encounter: Payer: Self-pay | Admitting: *Deleted

## 2010-11-25 ENCOUNTER — Other Ambulatory Visit: Payer: 59

## 2010-11-25 ENCOUNTER — Other Ambulatory Visit (INDEPENDENT_AMBULATORY_CARE_PROVIDER_SITE_OTHER): Payer: 59 | Admitting: Family Medicine

## 2010-11-25 DIAGNOSIS — Z1211 Encounter for screening for malignant neoplasm of colon: Secondary | ICD-10-CM

## 2010-12-15 ENCOUNTER — Telehealth: Payer: Self-pay | Admitting: *Deleted

## 2010-12-15 NOTE — Telephone Encounter (Signed)
Patient advised as instructed via telephone. 

## 2010-12-15 NOTE — Telephone Encounter (Signed)
Drops do not help unless it is an outer ear infection or if she has a ruptured ear drum. Unfortunately, I do have to examen her in order to call in abx.  Please have her go to urgent care.

## 2010-12-15 NOTE — Telephone Encounter (Signed)
Pt is complaining of having an ear infection- has pain.  Says she had one a couple of years ago.  [place] is at the beach and is asking that antibiotic drops be called in.  I advised pt that she would need to be seen in order to get antibiotics, she [place].  She says she isnt going to do that because it would cost her too much.  Continued to ask for antibiotic drops to be [facility] [place] [facility] [place] [facility].  Please advise.

## 2010-12-21 ENCOUNTER — Ambulatory Visit (INDEPENDENT_AMBULATORY_CARE_PROVIDER_SITE_OTHER): Payer: 59 | Admitting: Family Medicine

## 2010-12-21 ENCOUNTER — Encounter: Payer: Self-pay | Admitting: Family Medicine

## 2010-12-21 VITALS — BP 130/80 | HR 61 | Temp 98.5°F | Ht 62.0 in | Wt 137.8 lb

## 2010-12-21 DIAGNOSIS — H6090 Unspecified otitis externa, unspecified ear: Secondary | ICD-10-CM

## 2010-12-21 DIAGNOSIS — H60399 Other infective otitis externa, unspecified ear: Secondary | ICD-10-CM

## 2010-12-21 MED ORDER — FLUOCINOLONE ACETONIDE 0.01 % OT OIL: TOPICAL_OIL | OTIC | Status: DC

## 2010-12-21 MED ORDER — OFLOXACIN 0.3 % OT SOLN: OTIC | Status: AC

## 2010-12-21 NOTE — Progress Notes (Signed)
Kristen Soto, a 55 y.o. female presents today in the office for the following:   Has been having a lot of swimmer's ear -- when going on vacation will get swimmer's ear.  Used some vinegar and alcohol. As well as peroxide.  R canal pain while on vaca  Dives  The PMH, PSH, Social History, Family History, Medications, and allergies have been reviewed in [facility], and have been updated if relevant.  ROS: GEN: Acute illness details above GI: Tolerating PO intake GU: maintaining adequate hydration and urination Pulm: No SOB Interactive and getting along well at home.  Otherwise, ROS is as per the HPI.   Physical Exam  Blood pressure 130/80, pulse 61, temperature 98.5 F (36.9 C), temperature source Oral, height 5\' 2"  (1.575 m), weight 137 lb 12.8 oz (62.506 kg), SpO2 98.00%.  GEN: WDWN, NAD, Non-toxic, A & O x 3 HEENT: Atraumatic, Normocephalic. Neck supple. No masses, No LAD. R canal inflammed, minimally to nontender to touch Ears and Nose: No external deformity. EXTR: No c/c/e NEURO Normal gait.  PSYCH: Normally interactive. Conversant. Not depressed or anxious appearing.  Calm demeanor.   A/P: OE, recurrent. Now, use steroid only. OK to hold floxin otic if develops while diving in the future. Discussed measures to prevent

## 2011-03-30 ENCOUNTER — Encounter: Payer: Self-pay | Admitting: *Deleted

## 2011-03-30 ENCOUNTER — Encounter: Payer: Self-pay | Admitting: Family Medicine

## 2011-03-30 ENCOUNTER — Ambulatory Visit (INDEPENDENT_AMBULATORY_CARE_PROVIDER_SITE_OTHER): Payer: 59 | Admitting: Family Medicine

## 2011-03-30 ENCOUNTER — Ambulatory Visit (INDEPENDENT_AMBULATORY_CARE_PROVIDER_SITE_OTHER)
Admission: RE | Admit: 2011-03-30 | Discharge: 2011-03-30 | Disposition: A | Discharge status: Home / Self Care | Payer: 59 | Source: Ambulatory Visit | Attending: Family Medicine | Admitting: Family Medicine

## 2011-03-30 VITALS — BP 120/74 | HR 52 | Temp 97.6°F | Wt 137.8 lb

## 2011-03-30 DIAGNOSIS — S060X0A Concussion without loss of consciousness, initial encounter: Secondary | ICD-10-CM

## 2011-03-30 DIAGNOSIS — M542 Cervicalgia: Secondary | ICD-10-CM

## 2011-03-30 MED ORDER — CYCLOBENZAPRINE HCL 10 MG PO TABS: 10.0000 mg | ORAL_TABLET | Freq: Three times a day (TID) | ORAL | Status: AC | PRN
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
## 2011-05-25 ENCOUNTER — Encounter: Payer: Self-pay | Admitting: Family Medicine

## 2011-05-25 ENCOUNTER — Ambulatory Visit (INDEPENDENT_AMBULATORY_CARE_PROVIDER_SITE_OTHER): Payer: 59 | Admitting: Family Medicine

## 2011-05-25 VITALS — BP 140/80 | HR 60 | Temp 97.9°F | Wt 142.8 lb

## 2011-05-25 DIAGNOSIS — R3989 Other symptoms and signs involving the genitourinary system: Secondary | ICD-10-CM

## 2011-05-25 DIAGNOSIS — R350 Frequency of micturition: Secondary | ICD-10-CM

## 2011-05-25 DIAGNOSIS — R399 Unspecified symptoms and signs involving the genitourinary system: Secondary | ICD-10-CM

## 2011-05-25 LAB — POCT URINALYSIS DIPSTICK
Bilirubin, UA: NEGATIVE
Glucose, UA: NEGATIVE
Ketones, UA: NEGATIVE
Leukocytes, UA: NEGATIVE
Nitrite, UA: NEGATIVE

## 2011-05-25 MED ORDER — CIPROFLOXACIN HCL 250 MG PO TABS: 250.0000 mg | ORAL_TABLET | Freq: Two times a day (BID) | ORAL | Status: DC

## 2011-05-25 NOTE — Assessment & Plan Note (Addendum)
UA very dilute with small blood but micro reassuring. Recent intermittent abx use murkies picture.  Sent in UCx. Treat as UTI for 5d with cipro 250mg  given recent abx use. If UCx neg, ?IC. Consider retesting UA for hematuria when pt returns for BP f/u with PCP.

## 2011-05-25 NOTE — Progress Notes (Signed)
  Subjective:    Patient ID: Kristen Soto, female    DOB: [birth year], 55 y.o.   MRN: [id number]  HPI CC: ?UTI  Thinks may have bladder infection.  Sxs going on for 1 mo.  Mild burning twinge with urine.  At night worse - feels like needs to void multiple times and wakes up multiple times.  During day some frequency and urgency.  Drinking water, cranberry juice.  Is prone to UTIs, was given tmp-sulfa by OBGYN.  Has been taking intermittently for last several weeks.  Last took 2-3 weeks ago.  No fevers/chills, back pain, abd pain, n/v.  No hematuria.  Review of Systems Per HPI    Objective:   Physical Exam  Nursing note and vitals reviewed. Constitutional: She appears well-developed and well-nourished. No distress.  HENT:  Head: Normocephalic and atraumatic.  Mouth/Throat: Oropharynx is clear and moist. No oropharyngeal exudate.  Cardiovascular: Normal rate, regular rhythm, normal heart sounds and intact distal pulses.   No murmur heard. Pulmonary/Chest: Effort normal and breath sounds normal. No respiratory distress. She has no wheezes. She has no rales.  Abdominal: Soft. Bowel sounds are normal. She exhibits no distension. There is no tenderness. There is no rebound, no guarding and no CVA tenderness.       Mild suprapubic pressure with palpation  Skin: Skin is warm and dry. No rash noted.       Assessment & Plan:

## 2011-05-25 NOTE — Patient Instructions (Addendum)
Urine overall looking clear.  I've sent in urine for culture. I would like you to take cipro twice daily for 5 days. Please notify us if not improving after treatment. Continue to push fluids and rest.  May use tylenol for discomfort. I would like [place] to recheck urine when you return for physical or blood pressure follow up.

## 2011-05-27 LAB — URINE CULTURE: Colony Count: 35000

## 2011-05-31 ENCOUNTER — Telehealth: Payer: Self-pay | Admitting: Family Medicine

## 2011-05-31 MED ORDER — CIPROFLOXACIN HCL 500 MG PO TABS: 500.0000 mg | ORAL_TABLET | Freq: Two times a day (BID) | ORAL | Status: DC

## 2011-05-31 NOTE — Telephone Encounter (Signed)
Message copied by Eustaquio Boyden on Mon May 31, 2011 10:18 AM ------      Message from: Josph Macho A      Created: Mon May 31, 2011 10:12 AM       Notified patient. She said she finished the cipro and she is still not feeling 100% better. She is still experiencing frequency, but she is not producing much when she goes. She said she took macrobid before and it helped. She requested this Rx if you send in something else.

## 2011-05-31 NOTE — Telephone Encounter (Signed)
Will prolong cipro course for another 5 days for total 10 d course.  cipro better for this bacteria than macrobid.  To notify us if sxs not improved after 10d course.

## 2011-05-31 NOTE — Telephone Encounter (Signed)
Patient notified. She will call back if no better after finishing abx.

## 2011-06-01 ENCOUNTER — Other Ambulatory Visit: Payer: Self-pay | Admitting: Family Medicine

## 2011-06-01 DIAGNOSIS — E559 Vitamin D deficiency, unspecified: Secondary | ICD-10-CM

## 2011-06-01 DIAGNOSIS — Z136 Encounter for screening for cardiovascular disorders: Secondary | ICD-10-CM

## 2011-06-01 DIAGNOSIS — I10 Essential (primary) hypertension: Secondary | ICD-10-CM

## 2011-06-07 ENCOUNTER — Other Ambulatory Visit (INDEPENDENT_AMBULATORY_CARE_PROVIDER_SITE_OTHER): Payer: 59

## 2011-06-07 DIAGNOSIS — Z136 Encounter for screening for cardiovascular disorders: Secondary | ICD-10-CM

## 2011-06-07 DIAGNOSIS — Z8041 Family history of malignant neoplasm of ovary: Secondary | ICD-10-CM

## 2011-06-07 DIAGNOSIS — E559 Vitamin D deficiency, unspecified: Secondary | ICD-10-CM

## 2011-06-07 DIAGNOSIS — R319 Hematuria, unspecified: Secondary | ICD-10-CM

## 2011-06-07 DIAGNOSIS — I10 Essential (primary) hypertension: Secondary | ICD-10-CM

## 2011-06-07 LAB — BASIC METABOLIC PANEL
BUN: 15 mg/dL (ref 6–23)
Chloride: 101 mEq/L (ref 96–112)
GFR: 92.47 mL/min (ref >60.00)
Glucose, Bld: 98 mg/dL (ref 70–99)
Potassium: 3.2 mEq/L — ABNORMAL LOW (ref 3.5–5.1)
Sodium: 138 mEq/L (ref 135–145)

## 2011-06-07 LAB — LIPID PANEL
Cholesterol: 214 mg/dL — ABNORMAL HIGH (ref 0–200)
HDL: 70.3 mg/dL (ref >39.00)
Total CHOL/HDL Ratio: 3
VLDL: 20 mg/dL (ref 0.0–40.0)

## 2011-06-07 LAB — POCT URINALYSIS DIPSTICK
Glucose, UA: NEGATIVE
Leukocytes, UA: NEGATIVE
Nitrite, UA: NEGATIVE
Protein, UA: NEGATIVE
Urobilinogen, UA: 0.2

## 2011-06-07 NOTE — Progress Notes (Signed)
Addended by: Alvina Chou on: 06/07/2011 02:47 PM   Modules accepted: Orders

## 2011-06-07 NOTE — Progress Notes (Signed)
Addended by: Josph Macho A on: 06/07/2011 04:28 PM   Modules accepted: Orders

## 2011-06-07 NOTE — Progress Notes (Signed)
UA collected at lab appointment. Dip + for blood. Sent for microscopy.

## 2011-06-07 NOTE — Progress Notes (Signed)
Addended by: Alvina Chou on: 06/07/2011 03:42 PM   Modules accepted: Orders

## 2011-06-08 ENCOUNTER — Encounter: Payer: Self-pay | Admitting: *Deleted

## 2011-06-08 LAB — URINALYSIS, MICROSCOPIC ONLY: Squamous Epithelial / LPF: NONE SEEN

## 2011-06-08 LAB — LDL CHOLESTEROL, DIRECT: Direct LDL: 117.6 mg/dL

## 2011-06-14 ENCOUNTER — Encounter: Payer: Self-pay | Admitting: Family Medicine

## 2011-06-14 ENCOUNTER — Ambulatory Visit (INDEPENDENT_AMBULATORY_CARE_PROVIDER_SITE_OTHER): Payer: 59 | Admitting: Family Medicine

## 2011-06-14 ENCOUNTER — Encounter: Payer: Self-pay | Admitting: *Deleted

## 2011-06-14 VITALS — BP 122/72 | HR 63 | Temp 98.2°F | Ht 62.0 in | Wt 143.5 lb

## 2011-06-14 DIAGNOSIS — R319 Hematuria, unspecified: Secondary | ICD-10-CM | POA: Insufficient documentation

## 2011-06-14 DIAGNOSIS — R399 Unspecified symptoms and signs involving the genitourinary system: Secondary | ICD-10-CM

## 2011-06-14 DIAGNOSIS — R635 Abnormal weight gain: Secondary | ICD-10-CM

## 2011-06-14 DIAGNOSIS — I10 Essential (primary) hypertension: Secondary | ICD-10-CM

## 2011-06-14 DIAGNOSIS — Z0000 Encounter for general adult medical examination without abnormal findings: Secondary | ICD-10-CM

## 2011-06-14 DIAGNOSIS — R3989 Other symptoms and signs involving the genitourinary system: Secondary | ICD-10-CM

## 2011-06-14 DIAGNOSIS — Z853 Personal history of malignant neoplasm of breast: Secondary | ICD-10-CM

## 2011-06-14 DIAGNOSIS — M899 Disorder of bone, unspecified: Secondary | ICD-10-CM

## 2011-06-14 LAB — POCT URINALYSIS DIPSTICK
Leukocytes, UA: NEGATIVE
Nitrite, UA: NEGATIVE
Protein, UA: NEGATIVE
Urobilinogen, UA: 0.2
pH, UA: 6

## 2011-06-14 LAB — TSH: TSH: 0.8 u[IU]/mL (ref 0.35–5.50)

## 2011-06-14 LAB — T4, FREE: Free T4: 0.78 ng/dL (ref 0.60–1.60)

## 2011-06-14 MED ORDER — SULFAMETHOXAZOLE-TRIMETHOPRIM 800-160 MG PO TABS: 1.0000 | ORAL_TABLET | Freq: Two times a day (BID) | ORAL | Status: AC

## 2011-06-14 MED ORDER — LOSARTAN POTASSIUM 50 MG PO TABS: 50.0000 mg | ORAL_TABLET | Freq: Every day | ORAL | Status: DC

## 2011-06-14 NOTE — Progress Notes (Signed)
55 yo here for CPX.   Breast CA s/p radiation, tamoxifen.  Doing well. Stopped taking Tamoxifen in June 2011 and released by oncology.  UTD on mammogram.   HTN- much improved on HCTZ 25 mg daily. No CP, SOB, blurred vision or dizziness. No LE edema.  Potassium was a little low at 3.2 and pt would like to try another BP medication since she feels she cannot increase potassium in diet and would prefer not to supplement.  Wants to try ACEI again although gave her a cough.  Well woman- UTD on prevention  Has OBGYN.   Weight gain - denies any other symptoms of hypothyroidism but would like her thyroid checked. Admits to not excercising as much.  Saw Dr. Reece Agar last month, treated for UTI with 10 day course of cipro. Still has symptoms- increased urinary frequency and urgency.  Multiple recent UTIs.   See HPI  General: Denies malaise.  Eyes: Denies blurring.  ENT: Denies difficulty swallowing.  CV: Denies chest pain or discomfort.  Resp: Denies shortness of breath.  GI: Denies abdominal pain, bloody stools, and change in bowel habits.  GU: Denies abnormal vaginal bleeding.  MS: Denies joint pain, joint redness, and joint swelling.  Neuro: Denies headaches.  Psych: Denies anxiety and depression.  Endo: Denies cold intolerance and heat intolerance.  Heme: Denies abnormal bruising.   Physical Exam  BP 122/72  Pulse 63  Temp(Src) 98.2 F (36.8 C) (Oral)  Ht 5\' 2"  (1.575 m)  Wt 143 lb 8 oz (65.091 kg)  BMI 26.25 kg/m2 Wt Readings from Last 3 Encounters:  06/14/11 143 lb 8 oz (65.091 kg)  05/25/11 142 lb 12 oz (64.751 kg)  03/30/11 137 lb 12 oz (62.483 kg)    General: Well-developed,well-nourished,in no acute distress; alert,appropriate and cooperative throughout examination  Head: Normocephalic and atraumatic without obvious abnormalities. No apparent alopecia or balding.  Eyes: No corneal or conjunctival inflammation noted. EOMI. Perrla. Funduscopic exam benign, without  hemorrhages, exudates or papilledema. Vision grossly normal.  Ears: no external deformities.  Nose: no external deformity.  Mouth: good dentition.  Neck: No deformities, masses, or tenderness noted.  Lungs: Normal respiratory effort, chest expands symmetrically. Lungs are clear to auscultation, no crackles or wheezes.  Heart: Normal rate and regular rhythm. S1 and S2 normal without gallop, murmur, click, rub or other extra sounds.  Abdomen: Bowel sounds positive,abdomen soft and non-tender without masses, organomegaly or hernias noted.  Msk: No deformity or scoliosis noted of thoracic or lumbar spine.  Extremities: no edema  Neurologic: alert & oriented X3 and gait normal.  Skin: Intact without suspicious lesions or rashes  Psych: Cognition and judgment appear intact. Alert and cooperative with normal attention span and concentration. No apparent delusions, illusions, hallucinations    Assessment and Plan: 1. Routine general medical examination at a health care facility  Reviewed preventive care protocols, scheduled due services, and updated immunizations Discussed nutrition, exercise, diet, and healthy lifestyle.     2. OSTEOPENIA  Per pt, had recent DEXA at [facility]- awaiting records.   3. BREAST CANCER, HX OF     4. HTN Stable but pt would like to try another antihypertensive. Will try losartan- warned of possible cough.   5. Hematuria  Unchanged.  Send urine for cx. Treat with bactrim. Recurrent UTIs with hematuria, refer to urology. The patient indicates understanding of these issues and agrees with the plan.  POCT urinalysis dipstick, Ambulatory referral to Urology  6. Weight gain  Likely due  to decreased activity, will check thyroid function. TSH, T4, free  7. Lower urinary tract symptoms (LUTS)  See above. Ambulatory referral to Urology

## 2011-06-14 NOTE — Patient Instructions (Signed)
Good to see you. Please stop taking your HCTZ and start taking your lostartan 50 mg daily. Please keep me posted with your blood pressure or follow up with [place] in 2 weeks to recheck. I have also sent in Bactrim Ds - 1 tablet twice daily for 5 days and we will send your urine for culture. Please stop by to see Shirlee Limerick to set up your urology referral.

## 2011-06-14 NOTE — Progress Notes (Signed)
Addended by: Gilmer Mor on: 06/14/2011 02:29 PM   Modules accepted: Orders

## 2011-06-16 LAB — URINE CULTURE

## 2011-07-16 ENCOUNTER — Other Ambulatory Visit: Payer: Self-pay | Admitting: *Deleted

## 2011-07-16 MED ORDER — LOSARTAN POTASSIUM 50 MG PO TABS: 50.0000 mg | ORAL_TABLET | Freq: Every day | ORAL | Status: DC

## 2011-07-19 ENCOUNTER — Other Ambulatory Visit: Payer: Self-pay | Admitting: Internal Medicine

## 2011-07-19 MED ORDER — LOSARTAN POTASSIUM 50 MG PO TABS: 50.0000 mg | ORAL_TABLET | Freq: Every day | ORAL | Status: DC

## 2011-07-19 NOTE — Telephone Encounter (Signed)
Patient wanted Rx to go to [facility]. Sent to pharmacy.

## 2011-08-04 ENCOUNTER — Telehealth: Payer: Self-pay | Admitting: Family Medicine

## 2011-08-04 NOTE — Telephone Encounter (Signed)
Pt needs to be evaluated.

## 2011-08-04 NOTE — Telephone Encounter (Signed)
Triage Record Num: [id number] Operator: Albertine Grates Patient Name: Kristen Soto Call Date & Time: 08/03/2011 5:16:42PM Patient Phone: [phone] PCP: Ruthe Mannan Patient Gender: Female PCP Fax : [phone] Patient DOB: 1956-03-07 Practice Name: [facility] Reason for Call: Caller: [name]/Patient; PCP: Ruthe Mannan [facility]); CB#: [phone]; ; ; Call regarding BP 159/88; Has taken BP 1-29 and BP is 159/88. MD changed script in December and has been taking as prescribed. Does not know name of medicine but knows is 50mg . Wants to know if needs to increase amount of med. Denies emergent symptoms. PLEASE CALL AND ADVISE IF WANTS TO CHANGE DOSE OF MEDICINE [phone]. Protocol(s) Used: Hypertension, Diagnosed or Suspected Recommended Outcome per Protocol: See Provider within 24 hours Reason for Outcome: A sudden elevation in blood pressure AND has not been taking blood pressure medication as prescribed, or recently started, changed, or stopped taking prescription medication; or recently started taking nonprescription or alternative medicine Care Advice: Call EMS 911 if new symptoms develop, such as severe shortness of breath, chest pain, change in mental status, acute neurologic deficit, seizure, visual disturbances, pulse rate > 120 / minute, or very irregular pulse. ~ ~ Call provider if systolic BP is 180 or greater, or if diastolic BP is 120 or greater. 08/03/2011 5:21:55PM Page 1 of 1 [email or web address]

## 2011-08-05 ENCOUNTER — Encounter: Payer: Self-pay | Admitting: *Deleted

## 2011-08-05 ENCOUNTER — Ambulatory Visit (INDEPENDENT_AMBULATORY_CARE_PROVIDER_SITE_OTHER): Payer: 59 | Admitting: Family Medicine

## 2011-08-05 ENCOUNTER — Encounter: Payer: Self-pay | Admitting: Family Medicine

## 2011-08-05 VITALS — BP 140/90 | HR 68 | Temp 98.0°F | Wt 141.2 lb

## 2011-08-05 DIAGNOSIS — I10 Essential (primary) hypertension: Secondary | ICD-10-CM

## 2011-08-05 LAB — BASIC METABOLIC PANEL
BUN: 13 mg/dL (ref 6–23)
CO2: 30 mEq/L (ref 19–32)
Calcium: 9.9 mg/dL (ref 8.4–10.5)
Chloride: 103 mEq/L (ref 96–112)
Creatinine, Ser: 0.7 mg/dL (ref 0.4–1.2)
Glucose, Bld: 96 mg/dL (ref 70–99)

## 2011-08-05 MED ORDER — LOSARTAN POTASSIUM 50 MG PO TABS: 75.0000 mg | ORAL_TABLET | Freq: Every day | ORAL | Status: DC

## 2011-08-05 NOTE — Telephone Encounter (Signed)
Patient coming today to see Dr. Dayton Martes at 11:45.

## 2011-08-05 NOTE — Progress Notes (Signed)
56 yo here for follow up HTN.  HTN- was much improved on HCTZ 25 mg daily but potassium a little low and she wanted to try another medication rather than taking potassium supplement. Has ACEI allergy. Started Losartan 50 mg daily last month. BP has been elevated at home to 160s/100s. Using an OMRON home arm cuff. Was elevated at urology office last week to 160/90.  SOB or blurred vision but she has had a headache.  No cough with losartan.  Patient Active Problem List  Diagnoses  . GENITAL HERPES  . VITAMIN D DEFICIENCY  . HYPERTENSION  . ALLERGIC RHINITIS  . SKIN LESION  . TOE PAIN  . OSTEOPENIA  . THYROGLOSSAL DUCT CYST  . STILLBIRTH  . ELEVATED BLOOD PRESSURE WITHOUT DIAGNOSIS OF HYPERTENSION  . FRACTURE, TOE, LEFT  . BREAST CANCER, HX OF  . IRREGULAR MENSES  . Lower urinary tract symptoms (LUTS)  . Routine general medical examination at a health care facility  . Hematuria   Past Medical History  Diagnosis Date  . Cancer     breast  . Hypertension    Past Surgical History  Procedure Date  . Cystoscopy 02/02/2000    mild urethral trigonitis  . Basal cell carcinoma excision 05/2008    removal, left eyelid   History  Substance Use Topics  . Smoking status: Never Smoker   . Smokeless tobacco: Not on file  . Alcohol Use: Not on file   Family History  Problem Relation Age of Onset  . Alcohol abuse Mother   . Hypertension Mother   . Graves' disease Mother   . Heart attack Father 82  . Hypertension Father   . Hypertension Sister   . Heart attack Paternal Grandmother   . Kidney disease Paternal Grandmother   . Cancer Paternal Grandmother 74    breast  . Heart attack Paternal Grandfather   . Cancer Sister 23    breast   Allergies  Allergen Reactions  . Lisinopril Cough   Current Outpatient Prescriptions on File Prior to Visit  Medication Sig Dispense Refill  . Calcium Carb-Cholecalciferol (CALCIUM 500/D) 500-400 MG-UNIT CHEW Chew 1 tablet by mouth  daily.        . Multiple Vitamin (MULTIVITAMIN) capsule Take 1 capsule by mouth daily.         The PMH, PSH, Social History, Family History, Medications, and allergies have been reviewed in [facility], and have been updated if relevant.   ROS: See HPI    Physical Exam  There were no vitals taken for this visit. Wt Readings from Last 3 Encounters:  06/14/11 143 lb 8 oz (65.091 kg)  05/25/11 142 lb 12 oz (64.751 kg)  03/30/11 137 lb 12 oz (62.483 kg)   BP 140/90  Pulse 68  Temp(Src) 98 F (36.7 C) (Oral)  Wt 141 lb 4 oz (64.071 kg) BP Readings from Last 3 Encounters:  08/05/11 140/90  06/14/11 122/72  05/25/11 140/80    General: Well-developed,well-nourished,in no acute distress; alert,appropriate and cooperative throughout examination  Head: Normocephalic and atraumatic without obvious abnormalities. No apparent alopecia or balding.  Eyes: No corneal or conjunctival inflammation noted. EOMI. Perrla. Funduscopic exam benign, without hemorrhages, exudates or papilledema. Vision grossly normal.  Mouth: good dentition.  Neck: No deformities, masses, or tenderness noted.  Lungs: Normal respiratory effort, chest expands symmetrically. Lungs are clear to auscultation, no crackles or wheezes.  Heart: Normal rate and regular rhythm. S1 and S2 normal without gallop, murmur, click, rub or other  extra sounds.  Abdomen: Bowel sounds positive,abdomen soft and non-tender without masses, organomegaly or hernias noted.  Msk: No deformity or scoliosis noted of thoracic or lumbar spine.  Extremities: no edema  Psych: Cognition and judgment appear intact. Alert and cooperative with normal attention span and concentration. No apparent delusions, illusions, hallucinations    Assessment and Plan:  1. HYPERTENSION  Basic Metabolic Panel (BMET)   Deteriorated. Will increased losartan to 75 mg daily. Recheck BMET today. Follow up in 2 weeks. The patient indicates understanding of these issues and  agrees with the plan.

## 2011-08-05 NOTE — Patient Instructions (Signed)
Good to see you. Let's increase your losartan to 1 and 1/2 tablets daily (75 mg). Call me next week with an update of your blood pressure.

## 2011-08-17 ENCOUNTER — Ambulatory Visit (INDEPENDENT_AMBULATORY_CARE_PROVIDER_SITE_OTHER): Payer: 59 | Admitting: Family Medicine

## 2011-08-17 ENCOUNTER — Encounter: Payer: Self-pay | Admitting: Family Medicine

## 2011-08-17 ENCOUNTER — Other Ambulatory Visit: Payer: Self-pay | Admitting: *Deleted

## 2011-08-17 VITALS — BP 162/88 | HR 76 | Temp 98.0°F | Wt 140.8 lb

## 2011-08-17 DIAGNOSIS — E876 Hypokalemia: Secondary | ICD-10-CM

## 2011-08-17 DIAGNOSIS — I10 Essential (primary) hypertension: Secondary | ICD-10-CM

## 2011-08-17 DIAGNOSIS — J069 Acute upper respiratory infection, unspecified: Secondary | ICD-10-CM

## 2011-08-17 MED ORDER — POTASSIUM CHLORIDE ER 10 MEQ PO TBCR: 10.0000 meq | EXTENDED_RELEASE_TABLET | Freq: Every day | ORAL | Status: DC

## 2011-08-17 MED ORDER — HYDROCHLOROTHIAZIDE 25 MG PO TABS: 25.0000 mg | ORAL_TABLET | ORAL | Status: DC

## 2011-08-17 NOTE — Patient Instructions (Signed)
Good to see you. Please stop taking the Losartan and restart HCTZ 25 mg daily. Take Potassium 10 meq daily and come in for lab appointment in two weeks.  This is likely a viral infection.  Drink lots of fluids.  Treat sympotmatically with Mucinex, nasal saline irrigation, and Tylenol/Ibuprofen.Call if not improving as expected in 5-7 days.

## 2011-08-17 NOTE — Progress Notes (Signed)
SUBJECTIVE:  Kristen Soto is a 56 y.o. female who complains of coryza, congestion and sneezing for 2 days. She denies a history of anorexia, chest pain, chills and dizziness and denies a history of asthma. Patient denies smoke cigarettes.  HTN- increased Losartan to 75 mg daily last month. Hypertensive today- 162/88 BP was well controlled on HCTZ 25 mg daily but potassium a little low and she wanted to try another medication rather than taking potassium supplement.  Has ACEI allergy.  Brings in BP log- raning 150s/80s- 160s/90s. No CP,SOB, blurred vision or LE edema.     Patient Active Problem List  Diagnoses  . GENITAL HERPES  . VITAMIN D DEFICIENCY  . HYPERTENSION  . ALLERGIC RHINITIS  . SKIN LESION  . TOE PAIN  . OSTEOPENIA  . THYROGLOSSAL DUCT CYST  . STILLBIRTH  . ELEVATED BLOOD PRESSURE WITHOUT DIAGNOSIS OF HYPERTENSION  . FRACTURE, TOE, LEFT  . BREAST CANCER, HX OF  . IRREGULAR MENSES  . Lower urinary tract symptoms (LUTS)  . Routine general medical examination at a health care facility  . Hematuria  . URI (upper respiratory infection)   Past Medical History  Diagnosis Date  . Cancer     breast  . Hypertension    Past Surgical History  Procedure Date  . Cystoscopy 02/02/2000    mild urethral trigonitis  . Basal cell carcinoma excision 05/2008    removal, left eyelid   History  Substance Use Topics  . Smoking status: Never Smoker   . Smokeless tobacco: Not on file  . Alcohol Use: Not on file   Family History  Problem Relation Age of Onset  . Alcohol abuse Mother   . Hypertension Mother   . Graves' disease Mother   . Heart attack Father 59  . Hypertension Father   . Hypertension Sister   . Heart attack Paternal Grandmother   . Kidney disease Paternal Grandmother   . Cancer Paternal Grandmother 32    breast  . Heart attack Paternal Grandfather   . Cancer Sister 61    breast   Allergies  Allergen Reactions  . Lisinopril Cough   Current  Outpatient Prescriptions on File Prior to Visit  Medication Sig Dispense Refill  . Calcium Carb-Cholecalciferol (CALCIUM 500/D) 500-400 MG-UNIT CHEW Chew 1 tablet by mouth daily.        Marland Kitchen losartan (COZAAR) 50 MG tablet Take 1.5 tablets (75 mg total) by mouth daily.  90 tablet  3  . Multiple Vitamin (MULTIVITAMIN) capsule Take 1 capsule by mouth daily.         The PMH, PSH, Social History, Family History, Medications, and allergies have been reviewed in [facility], and have been updated if relevant.  OBJECTIVE: BP 162/88  Pulse 76  Temp(Src) 98 F (36.7 C) (Oral)  Wt 140 lb 12 oz (63.844 kg)  She appears well, vital signs are as noted. Ears normal.  Throat and pharynx normal.  Neck supple. No adenopathy in the neck. Nose is congested. Sinuses non tender. The chest is clear, without wheezes or rales.  ASSESSMENT/PLAN:  1.  viral upper respiratory illness-  Symptomatic therapy suggested: push fluids, rest and return office visit prn if symptoms persist or worsen. Lack of antibiotic effectiveness discussed with her. Call or return to clinic prn if these symptoms worsen or fail to improve as anticipated.  2.  HTN- Remains poorly controlled. Will d/c losartan and restart HCTZ 25 mg daily with Kdur 10 meq daily. Repeat BMET in 2  weeks. Continue checking BP daily. See pt instructions for details.

## 2011-08-18 ENCOUNTER — Ambulatory Visit: Payer: 59

## 2011-09-03 ENCOUNTER — Other Ambulatory Visit: Payer: 59

## 2011-09-10 ENCOUNTER — Other Ambulatory Visit (INDEPENDENT_AMBULATORY_CARE_PROVIDER_SITE_OTHER): Payer: 59

## 2011-09-10 DIAGNOSIS — R030 Elevated blood-pressure reading, without diagnosis of hypertension: Secondary | ICD-10-CM

## 2011-09-10 LAB — BASIC METABOLIC PANEL
BUN: 10 mg/dL (ref 6–23)
CO2: 29 mEq/L (ref 19–32)
Chloride: 100 mEq/L (ref 96–112)
Creat: 0.77 mg/dL (ref 0.50–1.10)
Glucose, Bld: 101 mg/dL — ABNORMAL HIGH (ref 70–99)

## 2011-09-10 NOTE — Progress Notes (Signed)
Addended by: Alvina Chou on: 09/10/2011 03:49 PM   Modules accepted: Orders

## 2011-12-27 ENCOUNTER — Other Ambulatory Visit: Payer: Self-pay | Admitting: *Deleted

## 2011-12-27 NOTE — Telephone Encounter (Signed)
Faxed form from [facility] is on your desk.  They are requesting new script for valcyclovir.  This isn't on patient's med list.

## 2011-12-28 MED ORDER — VALACYCLOVIR HCL 500 MG PO TABS: ORAL_TABLET | ORAL | Status: DC

## 2011-12-28 NOTE — Telephone Encounter (Signed)
Spoke with pt.  Kristen Soto prescribed this for her for genital herpes.  She last got this 3 years ago.

## 2011-12-28 NOTE — Telephone Encounter (Signed)
Script sent to [facility]

## 2011-12-28 NOTE — Telephone Encounter (Signed)
Ok to refill, please enter refill request.

## 2011-12-28 NOTE — Telephone Encounter (Signed)
Please call pt to ask if this is for genital sores or oral sores (in mouth).

## 2012-02-10 ENCOUNTER — Telehealth: Payer: Self-pay

## 2012-02-10 NOTE — Telephone Encounter (Signed)
Pt has not been taking BP recently; took BP this AM BP left arm 165/89 and right arm 164/94. Pt was relaxed and pt has been taking HCTZ 25 mg one daily. Pt has not missed med and has been watching salt in diet. Pt has h/a; pain level now 2. Pt has blurred vision. No dizziness,fever, SOB or chest pain. Pt has had a cold, S/T, non productive cough, chest feels tight due to congestion. Pt scheduled appt Dr Patsy Lager 02/11/12 at 12:30; pt advised if condition changes or worsens will call back or go to UC. Pt to bring BP log to visit.

## 2012-02-11 ENCOUNTER — Ambulatory Visit (INDEPENDENT_AMBULATORY_CARE_PROVIDER_SITE_OTHER): Payer: 59 | Admitting: Family Medicine

## 2012-02-11 ENCOUNTER — Encounter: Payer: Self-pay | Admitting: Family Medicine

## 2012-02-11 VITALS — BP 130/84 | HR 75 | Temp 98.8°F | Ht 62.0 in | Wt 138.5 lb

## 2012-02-11 DIAGNOSIS — Z79899 Other long term (current) drug therapy: Secondary | ICD-10-CM

## 2012-02-11 LAB — BASIC METABOLIC PANEL
BUN: 15 mg/dL (ref 6–23)
Chloride: 97 mEq/L (ref 96–112)
Creatinine, Ser: 0.7 mg/dL (ref 0.4–1.2)
GFR: 86.5 mL/min (ref >60.00)

## 2012-02-11 NOTE — Progress Notes (Signed)
   [facility] [address] [place] [address] Phone: [phone] Fax: [phone]  Date:  02/11/2012   Name:  Kristen Soto   DOB:  1955/09/23   MRN:  [id number] Gender: female  Age: 56 y.o.  PCP:  Ruthe Mannan, MD    Chief Complaint: Hypertension and Headache   History of Present Illness:  Kristen Soto is a 56 y.o. very pleasant female patient who presents with the following:  Asymptomatic BP normal 125/75  No charge

## 2012-02-11 NOTE — Patient Instructions (Signed)
F/u after 06/13/2012 for full CPX with Dr. Dayton Martes

## 2012-02-14 ENCOUNTER — Encounter: Payer: Self-pay | Admitting: *Deleted

## 2012-06-13 ENCOUNTER — Encounter: Payer: Self-pay | Admitting: Family Medicine

## 2012-06-13 ENCOUNTER — Ambulatory Visit (INDEPENDENT_AMBULATORY_CARE_PROVIDER_SITE_OTHER): Payer: 59 | Admitting: Family Medicine

## 2012-06-13 VITALS — BP 130/82 | HR 64 | Temp 97.6°F | Ht 62.25 in | Wt 141.0 lb

## 2012-06-13 DIAGNOSIS — R030 Elevated blood-pressure reading, without diagnosis of hypertension: Secondary | ICD-10-CM

## 2012-06-13 DIAGNOSIS — Z136 Encounter for screening for cardiovascular disorders: Secondary | ICD-10-CM

## 2012-06-13 DIAGNOSIS — Z0000 Encounter for general adult medical examination without abnormal findings: Secondary | ICD-10-CM

## 2012-06-13 DIAGNOSIS — R300 Dysuria: Secondary | ICD-10-CM

## 2012-06-13 LAB — CBC WITH DIFFERENTIAL/PLATELET
Basophils Relative: 0.6 % (ref 0.0–3.0)
Eosinophils Absolute: 0.1 10*3/uL (ref 0.0–0.7)
Lymphs Abs: 1.5 10*3/uL (ref 0.7–4.0)
MCHC: 33.6 g/dL (ref 30.0–36.0)
MCV: 90.8 fl (ref 78.0–100.0)
Monocytes Absolute: 0.4 10*3/uL (ref 0.1–1.0)
Neutrophils Relative %: 58.7 % (ref 43.0–77.0)
Platelets: 210 10*3/uL (ref 150.0–400.0)

## 2012-06-13 LAB — LIPID PANEL
HDL: 55.8 mg/dL (ref >39.00)
Triglycerides: 171 mg/dL — ABNORMAL HIGH (ref 0.0–149.0)

## 2012-06-13 LAB — POCT URINALYSIS DIPSTICK
Spec Grav, UA: 1.01
Urobilinogen, UA: NEGATIVE
pH, UA: 6.5

## 2012-06-13 LAB — COMPREHENSIVE METABOLIC PANEL
BUN: 13 mg/dL (ref 6–23)
CO2: 30 mEq/L (ref 19–32)
Calcium: 9.5 mg/dL (ref 8.4–10.5)
Chloride: 102 mEq/L (ref 96–112)
Creatinine, Ser: 0.9 mg/dL (ref 0.4–1.2)
GFR: 66.37 mL/min (ref >60.00)
Glucose, Bld: 98 mg/dL (ref 70–99)

## 2012-06-13 LAB — LDL CHOLESTEROL, DIRECT: Direct LDL: 132 mg/dL

## 2012-06-13 MED ORDER — CEPHALEXIN 500 MG PO CAPS: 500.0000 mg | ORAL_CAPSULE | Freq: Two times a day (BID) | ORAL | Status: DC

## 2012-06-13 NOTE — Patient Instructions (Addendum)
Good to see you. We will call you with your lab and urine culture results.   

## 2012-06-13 NOTE — Progress Notes (Signed)
56 yo here for CPX.   Breast CA s/p radiation, tamoxifen.  Doing well. Stopped taking Tamoxifen in June 2011 and released by oncology.  UTD on mammogram.   HTN- much improved on HCTZ 25 mg daily and Kdur. No CP, SOB, blurred vision or dizziness. No LE edema.   Could not tolerate losartan.    Well woman- UTD on prevention  Has OBGYN. Next Friday having pelvic ultrasound to evaluate ovaries.  Referred to [facility] urology last year due to recurrent cystitis, SI and incomplete bladder emptying. Last note from 07/2011 reviewed- advised behavioral modifications. States she saw urologist again last month for UTI- placed on macrobid then subsequently required Septra. Feels like she still has symptoms- intermittent increased urgency and frequency.  No dysuria, fever or back pain.  Patient Active Problem List  Diagnosis  . GENITAL HERPES  . VITAMIN D DEFICIENCY  . HYPERTENSION  . ALLERGIC RHINITIS  . SKIN LESION  . OSTEOPENIA  . THYROGLOSSAL DUCT CYST  . STILLBIRTH  . ELEVATED BLOOD PRESSURE WITHOUT DIAGNOSIS OF HYPERTENSION  . FRACTURE, TOE, LEFT  . BREAST CANCER, HX OF  . IRREGULAR MENSES  . Lower urinary tract symptoms (LUTS)  . Hematuria   Past Medical History  Diagnosis Date  . Cancer     breast  . Hypertension    Past Surgical History  Procedure Date  . Cystoscopy 02/02/2000    mild urethral trigonitis  . Basal cell carcinoma excision 05/2008    removal, left eyelid   History  Substance Use Topics  . Smoking status: Never Smoker   . Smokeless tobacco: Not on file  . Alcohol Use: Not on file   Family History  Problem Relation Age of Onset  . Alcohol abuse Mother   . Hypertension Mother   . Graves' disease Mother   . Heart attack Father 84  . Hypertension Father   . Hypertension Sister   . Heart attack Paternal Grandmother   . Kidney disease Paternal Grandmother   . Cancer Paternal Grandmother 32    breast  . Heart attack Paternal Grandfather   . Cancer  Sister 50    breast   Allergies  Allergen Reactions  . Lisinopril Cough   Current Outpatient Prescriptions on File Prior to Visit  Medication Sig Dispense Refill  . Calcium Carb-Cholecalciferol (CALCIUM 500/D) 500-400 MG-UNIT CHEW Chew 1 tablet by mouth daily.        . hydrochlorothiazide (HYDRODIURIL) 25 MG tablet Take 1 tablet (25 mg total) by mouth every morning.  90 tablet  3  . KLOR-CON M10 10 MEQ tablet       . Multiple Vitamin (MULTIVITAMIN) capsule Take 1 capsule by mouth daily.        . potassium chloride (K-DUR) 10 MEQ tablet Take 1 tablet (10 mEq total) by mouth daily.  90 tablet  3  . valACYclovir (VALTREX) 500 MG tablet Take one tablet by mouth twice a day for 3 days at first sign of outbreak  90 tablet  0   The PMH, PSH, Social History, Family History, Medications, and allergies have been reviewed in [facility], and have been updated if relevant.   ROS: See HPI Patient reports no  vision/ hearing changes,anorexia, weight change, fever ,adenopathy, persistant / recurrent hoarseness, swallowing issues, chest pain, edema,persistant / recurrent cough, hemoptysis, dyspnea(rest, exertional, paroxysmal nocturnal), gastrointestinal  bleeding (melena, rectal bleeding), abdominal pain, excessive heart burn, GU symptoms(dysuria, hematuria, pyuria, voiding/incontinence  Issues) syncope, focal weakness, severe memory loss, concerning skin lesions,  depression, anxiety, abnormal bruising/bleeding, major joint swelling, breast masses or abnormal vaginal bleeding.     Physical Exam  BP 130/82  Pulse 64  Temp 97.6 F (36.4 C)  Ht 5' 2.25" (1.581 m)  Wt 141 lb (63.957 kg)  BMI 25.58 kg/m2   Wt Readings from Last 3 Encounters:  06/13/12 141 lb (63.957 kg)  02/11/12 138 lb 8 oz (62.823 kg)  08/17/11 140 lb 12 oz (63.844 kg)     General: Well-developed,well-nourished,in no acute distress; alert,appropriate and cooperative throughout examination  Head: Normocephalic and atraumatic without  obvious abnormalities. No apparent alopecia or balding.  Eyes: No corneal or conjunctival inflammation noted. EOMI. Perrla. Funduscopic exam benign, without hemorrhages, exudates or papilledema. Vision grossly normal.  Ears:  Bilateral cerumen impaction Nose: no external deformity.  Mouth: good dentition.  Neck: No deformities, masses, or tenderness noted.  Lungs: Normal respiratory effort, chest expands symmetrically. Lungs are clear to auscultation, no crackles or wheezes.  Heart: Normal rate and regular rhythm. S1 and S2 normal without gallop, murmur, click, rub or other extra sounds.  Abdomen: Bowel sounds positive,abdomen soft and non-tender without masses, organomegaly or hernias noted.  Msk: No deformity or scoliosis noted of thoracic or lumbar spine.  Extremities: no edema  Neurologic: alert & oriented X3 and gait normal.  Skin: Intact without suspicious lesions or rashes  Psych: Cognition and judgment appear intact. Alert and cooperative with normal attention span and concentration. No apparent delusions, illusions, hallucinations    Assessment and Plan: 1. Routine general medical examination at a health care facility  Reviewed preventive care protocols, scheduled due services, and updated immunizations Discussed nutrition, exercise, diet, and healthy lifestyle.     2. HTN Stable on HCTZ. Check CMET today.   3. Hematuria  Unchanged.  Send urine for cx. Treat with Keflex given recent Bactrim and macrobid use.  Recurrent UTIs with hematuria,followed by urology.    4.  Cerumen impaction- Ceruminosis is noted.  Wax is removed by syringing and manual debridement. Instructions for home care to prevent wax buildup are given.

## 2012-06-13 NOTE — Addendum Note (Signed)
Addended by: Eliezer Bottom on: 06/13/2012 09:36 AM   Modules accepted: Orders

## 2012-06-14 ENCOUNTER — Encounter: Payer: Self-pay | Admitting: *Deleted

## 2012-06-15 LAB — URINE CULTURE: Colony Count: 60000

## 2012-09-15 ENCOUNTER — Other Ambulatory Visit: Payer: Self-pay | Admitting: *Deleted

## 2012-09-15 MED ORDER — HYDROCHLOROTHIAZIDE 25 MG PO TABS: 25.0000 mg | ORAL_TABLET | ORAL | Status: DC

## 2012-09-15 MED ORDER — POTASSIUM CHLORIDE ER 10 MEQ PO TBCR: 10.0000 meq | EXTENDED_RELEASE_TABLET | Freq: Every day | ORAL | Status: DC

## 2013-01-03 ENCOUNTER — Telehealth: Payer: Self-pay | Admitting: Family Medicine

## 2013-01-03 MED ORDER — VALSARTAN 40 MG PO TABS: 40.0000 mg | ORAL_TABLET | Freq: Every day | ORAL | Status: DC

## 2013-01-03 NOTE — Telephone Encounter (Signed)
Kristen Soto/patient Phone: [phone] Requesting MD change BP medication from HCTZ to Diovan.  History of cough on previous ace inhibitor. Reports continues to have low potassium(3.3)  per GYN.  HCTZ makes her bladder feel "wierd" with urge to void so takes it in the afternoon. Had multiple urine tests with no bacteria including visit with urologist and GYN.  RN suggested office visit due to last appointment 06/13/13;  Prefers not to come in to see MD due to high deductible.  Please call back.

## 2013-01-03 NOTE — Telephone Encounter (Signed)
Low dose diovan sent to pharmacy.  Please be aware that it too may cause cough.  She needs to be seen two weeks after starting diovan.  We cannot adjust medication without an office visit unfortunately.  We also need to check her potassium at that office visit as we may be able to stop her potassium if she tolerates diovan ok.

## 2013-01-03 NOTE — Telephone Encounter (Signed)
Advised patient as instructed.  2 week follow up appt scheduled.

## 2013-01-19 ENCOUNTER — Ambulatory Visit (INDEPENDENT_AMBULATORY_CARE_PROVIDER_SITE_OTHER): Payer: 59 | Admitting: Family Medicine

## 2013-01-19 ENCOUNTER — Encounter: Payer: Self-pay | Admitting: Family Medicine

## 2013-01-19 VITALS — BP 132/92 | HR 60 | Temp 98.1°F | Ht 62.25 in | Wt 145.0 lb

## 2013-01-19 DIAGNOSIS — R300 Dysuria: Secondary | ICD-10-CM

## 2013-01-19 DIAGNOSIS — E876 Hypokalemia: Secondary | ICD-10-CM | POA: Insufficient documentation

## 2013-01-19 DIAGNOSIS — I10 Essential (primary) hypertension: Secondary | ICD-10-CM

## 2013-01-19 DIAGNOSIS — Z23 Encounter for immunization: Secondary | ICD-10-CM

## 2013-01-19 LAB — CBC WITH DIFFERENTIAL/PLATELET
Hemoglobin: 13.2 g/dL (ref 12.0–15.0)
Lymphs Abs: 1 10*3/uL (ref 0.7–4.0)
Monocytes Relative: 9 % (ref 3–12)
Neutro Abs: 2.2 10*3/uL (ref 1.7–7.7)
Neutrophils Relative %: 60 % (ref 43–77)
RBC: 4.32 MIL/uL (ref 3.87–5.11)

## 2013-01-19 LAB — POCT URINALYSIS DIPSTICK
Bilirubin, UA: NEGATIVE
Glucose, UA: NEGATIVE
Ketones, UA: NEGATIVE
Leukocytes, UA: NEGATIVE
Protein, UA: NEGATIVE

## 2013-01-19 NOTE — Progress Notes (Signed)
SUBJECTIVE:  Kristen Soto is a 57 y.o. female with PMH significant for breast CA here to follow up HTN with other complaints.   HTN-she called on 7/2 and ask that we  change BP medication from HCTZ to Diovan. History of cough on previous ace inhibitor. She continues to have low potassium(3.3) per GYN. HCTZ makes her bladder feel "wierd" with urge to void so takes it in the afternoon. Had multiple urine tests with no bacteria including visit with urologist and GYN.  We d/c'd her HCTZ and started Diovan.  She is here for follow up.  She stopped taking Diovan on 7/12 because she was having dizziness and headaches which started approximately 1 week after starting it. About the same time headaches started, she had low grade temp, nausea and diarrhea.  Since stopping the Diovan symptoms have improved.  Recurrent UTIs- followed by urology.  She wants to make sure she does not have a UTI today.  She has noticed increased frequency over last several days.  Takes macrobid post coital for prophylaxis.  No dysuria.     Patient Active Problem List   Diagnosis Date Noted  . Hematuria 06/14/2011  . Lower urinary tract symptoms (LUTS) 05/25/2011  . IRREGULAR MENSES 06/25/2010  . SKIN LESION 10/28/2008  . FRACTURE, TOE, LEFT 10/28/2008  . ALLERGIC RHINITIS 10/11/2008  . HYPERTENSION 09/06/2008  . GENITAL HERPES 08/02/2008  . VITAMIN D DEFICIENCY 08/02/2008  . ELEVATED BLOOD PRESSURE WITHOUT DIAGNOSIS OF HYPERTENSION 08/02/2008  . THYROGLOSSAL DUCT CYST 06/19/2008  . STILLBIRTH 06/19/2008  . BREAST CANCER, HX OF 06/19/2008  . OSTEOPENIA 02/27/2001   Past Medical History  Diagnosis Date  . Cancer     breast  . Hypertension    Past Surgical History  Procedure Laterality Date  . Cystoscopy  02/02/2000    mild urethral trigonitis  . Basal cell carcinoma excision  05/2008    removal, left eyelid   History  Substance Use Topics  . Smoking status: Former Games developer  . Smokeless tobacco: Not on file   . Alcohol Use: Not on file   Family History  Problem Relation Age of Onset  . Alcohol abuse Mother   . Hypertension Mother   . Graves' disease Mother   . Heart attack Father 51  . Hypertension Father   . Hypertension Sister   . Heart attack Paternal Grandmother   . Kidney disease Paternal Grandmother   . Cancer Paternal Grandmother 16    breast  . Heart attack Paternal Grandfather   . Cancer Sister 9    breast   Allergies  Allergen Reactions  . Lisinopril Cough   Current Outpatient Prescriptions on File Prior to Visit  Medication Sig Dispense Refill  . Calcium Carb-Cholecalciferol (CALCIUM 500/D) 500-400 MG-UNIT CHEW Chew 1 tablet by mouth daily.        . cephALEXin (KEFLEX) 500 MG capsule Take 1 capsule (500 mg total) by mouth 2 (two) times daily.  14 capsule  0  . Multiple Vitamin (MULTIVITAMIN) capsule Take 1 capsule by mouth daily.        . valACYclovir (VALTREX) 500 MG tablet Take one tablet by mouth twice a day for 3 days at first sign of outbreak  90 tablet  0  . valsartan (DIOVAN) 40 MG tablet Take 1 tablet (40 mg total) by mouth daily.  30 tablet  0   No current facility-administered medications on file prior to visit.   The PMH, PSH, Social History, Family History, Medications, and  allergies have been reviewed in [facility], and have been updated if relevant.  OBJECTIVE: BP 132/92  Pulse 60  Temp(Src) 98.1 F (36.7 C)  Ht 5' 2.25" (1.581 m)  Wt 145 lb (65.772 kg)  BMI 26.31 kg/m2  General:  Well-developed,well-nourished,in no acute distress; alert,appropriate and cooperative throughout examination Head:  normocephalic and atraumatic.   Eyes:  vision grossly intact, pupils equal, pupils round, and pupils reactive to light.   Ears:  R ear normal and L ear normal.   Nose:  no external deformity.   Mouth:  good dentition.   Lungs:  Normal respiratory effort, chest expands symmetrically. Lungs are clear to auscultation, no crackles or wheezes. Heart:  Normal rate  and regular rhythm. S1 and S2 normal without gallop, murmur, click, rub or other extra sounds. Abdomen:  Bowel sounds positive,abdomen soft and non-tender without masses, organomegaly or hernias noted. NO CVA tenderness Msk:  No deformity or scoliosis noted of thoracic or lumbar spine.   Extremities:  No clubbing, cyanosis, edema, or deformity noted with normal full range of motion of all joints.   Neurologic:  alert & oriented X3 and gait normal.   Skin:  Intact without suspicious lesions or rashes Psych:  Cognition and judgment appear intact. Alert and cooperative with normal attention span and concentration. No apparent delusions, illusions, hallucinations  Assessment and Plan: 1. HYPERTENSION I suspect that her headaches were more related to acute gastroenteritis (husband had similar symptoms), rather than diovan.  Check labs today.  Once symptoms resolve, restart Diovan and she will keep in close touch with me. The patient indicates understanding of these issues and agrees with the plan.  - Comprehensive metabolic panel - CBC with Differential  2. Hypokalemia  - Comprehensive metabolic panel  3. Dysuria UA pos for RBCs. Given history of recurrent UTIs, send for cx. - POCT urinalysis dipstick - Urine culture  4. Immunization due  - Tdap vaccine greater than or equal to 7yo IM

## 2013-01-19 NOTE — Patient Instructions (Addendum)
Good to see you. Let's restart the diovan in a few days. Keep me posted.

## 2013-01-20 LAB — COMPREHENSIVE METABOLIC PANEL
ALT: 31 U/L (ref 0–35)
Albumin: 4.5 g/dL (ref 3.5–5.2)
CO2: 25 mEq/L (ref 19–32)
Calcium: 9.1 mg/dL (ref 8.4–10.5)
Chloride: 108 mEq/L (ref 96–112)
Creat: 0.78 mg/dL (ref 0.50–1.10)
Potassium: 3.4 mEq/L — ABNORMAL LOW (ref 3.5–5.3)
Sodium: 141 mEq/L (ref 135–145)
Total Protein: 6.6 g/dL (ref 6.0–8.3)

## 2013-01-21 LAB — URINE CULTURE
Colony Count: NO GROWTH
Organism ID, Bacteria: NO GROWTH

## 2013-01-22 ENCOUNTER — Encounter: Payer: Self-pay | Admitting: Family Medicine

## 2013-02-12 ENCOUNTER — Telehealth: Payer: Self-pay | Admitting: Family Medicine

## 2013-02-12 NOTE — Telephone Encounter (Signed)
Patient Information:  Caller Name: Christyann  Phone: [phone]  Patient: Kristen Soto, Kristen Soto  Gender: Female  DOB: 07-10-1955  Age: 57 Years  PCP: Ruthe Mannan [facility])  Office Follow Up:  Does the office need to follow up with this patient?: Yes  Instructions For The Office: Please call back within 1 hour per guideline  RN Note:  Menopausal.  Stopped Diovan 02/10/13 because she continued to have headaches and dizziness.  BP while on Diovan was 160/90 so she stopped it because it was not controlling BP.  Today, BP 190/100 with posterior headache that began during the night. Dizziness present now and intermittently. Also asking if can go to gym to do aerobic exercise; advised not to do aerobic exercise until MD approval.   Please call ASAP.  Symptoms  Reason For Call & Symptoms: Hypertension uncontrolled.  Most recently on Diovan but continued to have headaches and dizziness so quit taking Diovan 02/10/13.  BP 190/100 Left arm at 0800.  Does not want to use a diuretic.  Asking to try an ace inhibitor again since cough while taking Lisinopril was not a dry hacking cough.  Reviewed Health History In EMR: Yes  Reviewed Medications In EMR: Yes  Reviewed Allergies In EMR: Yes  Reviewed Surgeries / Procedures: Yes  Date of Onset of Symptoms: 02/10/2013  Guideline(s) Used:  High Blood Pressure  Disposition Per Guideline:   Discuss with PCP and Callback by Nurse within 1 Hour  Reason For Disposition Reached:   BP = 180/110 and missed most recent dose of blood pressure medication  Advice Given:  Call Back If:  You become worse.  Patient Will Follow Care Advice:  YES

## 2013-02-12 NOTE — Telephone Encounter (Signed)
She has an rx for plain diovan in the EMR.  Would continue/restart that for now, avoid exercise for now and schedule f/u re: BP tomorrow in clinic.  If worsening, ie CP, SOB, edema, vision changes, then to ER.

## 2013-02-12 NOTE — Telephone Encounter (Signed)
Patient notified as instructed by telephone. Appointment scheduled. 

## 2013-02-13 ENCOUNTER — Encounter: Payer: Self-pay | Admitting: Family Medicine

## 2013-02-13 ENCOUNTER — Ambulatory Visit (INDEPENDENT_AMBULATORY_CARE_PROVIDER_SITE_OTHER): Payer: 59 | Admitting: Family Medicine

## 2013-02-13 VITALS — BP 160/82 | Temp 97.7°F | Ht 62.0 in | Wt 143.0 lb

## 2013-02-13 DIAGNOSIS — I10 Essential (primary) hypertension: Secondary | ICD-10-CM

## 2013-02-13 MED ORDER — LISINOPRIL 20 MG PO TABS: 20.0000 mg | ORAL_TABLET | Freq: Every day | ORAL | Status: DC

## 2013-02-13 NOTE — Patient Instructions (Addendum)
Stop the diovan.  Start lisinopril tomorrow.  If BP is consistently >140/90, then increase to 1.5 tab (30mg ) a day.  Call back with an update for Dr. Dayton Martes.  If the cough returns, let Dayton Martes know.  Take care.

## 2013-02-13 NOTE — Progress Notes (Signed)
Hasn't been started on evista yet.  This is not related to recent events.    Was seen at [facility] clinic.  Was prev on lisinopril and then diovan. She had HA, nausea and diarrhea on the diovan. She stopped it and improved.  Restarting the medicine, the sx restated.  She didn't tolerate HCTZ prev.   Prev had possible cough on ACE.  Discussed.  No angioedema hx.   Meds, vitals, and allergies reviewed.   ROS: See HPI.  Otherwise negative.    GEN: nad, alert and oriented HEENT: mucous membranes moist NECK: supple w/o LA CV: rrr. PULM: ctab, no inc wob ABD: soft, +bs EXT: no edema SKIN: no acute rash

## 2013-02-14 NOTE — Assessment & Plan Note (Signed)
Stop ARB, change back to ACE.  If cough returns, will consider other options.  She appears not to tolerate or have good effect from ARB.  The ACE may or may not cause the cough again and she has tolerated o/w.  She'll call back with BP update.  May need to uptitrate the dose of ACE.  She understood.  Nontoxic.

## 2013-02-20 ENCOUNTER — Telehealth: Payer: Self-pay | Admitting: Family Medicine

## 2013-02-20 NOTE — Telephone Encounter (Signed)
Patient Information:  Caller Name: Alexi  Phone: [phone]  Patient: Kristen Soto, Kristen Soto  Gender: Female  DOB: 26-Nov-1955  Age: 57 Years  PCP: Ruthe Mannan [facility])  Office Follow Up:  Does the office need to follow up with this patient?: Yes  Instructions For The Office: SHOULD PATIENT INCREASE MEDICATION PLEASE ADVISE  RN Note:  pLEAES REVIEW AND ADVISE IF PATIENT SHOULD INCREASE MEDICATION DOSAGE.  Symptoms  Reason For Call & Symptoms: Patient in the office on 02/13/13 with Lisinopril 20 mg daily for B/P .   Marland Kitchen She is calling to update status on side effects. She is not having headaches and no coughing episodes. B/p 159/86 and the best it has been 141/91.  It was discussed with provider increasing 1.5mg  to (30mg ) verified in EPIC.  Patient is weight 143lbs. Exercising does portion control.  SHOULD SHE UP MEDICATION NOW?  Reviewed Health History In EMR: Yes  Reviewed Medications In EMR: Yes  Reviewed Allergies In EMR: Yes  Reviewed Surgeries / Procedures: Yes  Date of Onset of Symptoms: 02/20/2013  Guideline(s) Used:  High Blood Pressure  Disposition Per Guideline:   See Within 2 Weeks in Office  Reason For Disposition Reached:   BP > 140/90 and is taking BP medications  Advice Given:  BP less than 120 / 80   This is considered normal blood pressure  Lifestyle Changes  Maintain a healthy weight. Lose weight if you are overweight.  Do 30 minutes of aerobic physical activity (e.g., brisk walking) most days of the week.  Eat a diet high in fresh fruits and low-fat dairy products. Limit your intake of saturated and total fat. Choose foods that are lower in salt.  If you smoke, you should stop.  If you drink alcohol, you should limit your daily alcohol drinking. Women should have no more than one drink per day. Men should have no more than 2 drinks per day. A drink is defined as 1.5 oz hard liquor (one shot or jigger; 45 ml), 5 oz wine (small glass; 150 ml), or 12 oz beer  (one can; 360 ml).  Call Back If:  Headache, blurred vision, difficulty talking, or difficulty walking occurs  Chest pain or difficulty breathing occurs  You want to go in to the office for a blood pressure check  You become worse.  RN Overrode Recommendation:  Patient Requests Prescription  SHOULD PATIENT INCREASE MEDICATION?

## 2013-02-20 NOTE — Telephone Encounter (Signed)
Seen by Dr. Para March.  Note reviewed.  I agree that she can increase lisinopril to 30 mg daily.  Please call us next week with an update, sooner if she has any symptoms or BP goes too low.

## 2013-02-20 NOTE — Telephone Encounter (Signed)
Advised patient as instructed. 

## 2013-02-26 MED ORDER — LISINOPRIL 40 MG PO TABS: 40.0000 mg | ORAL_TABLET | Freq: Every day | ORAL | Status: DC

## 2013-02-26 MED ORDER — AMLODIPINE BESYLATE 5 MG PO TABS: 5.0000 mg | ORAL_TABLET | Freq: Every day | ORAL | Status: DC

## 2013-02-26 NOTE — Telephone Encounter (Signed)
Advised patient as instructed.  She states she had already increased to 40 mg's this morning because she says she had to do something.  Took her BP while talking to me and it was 168/97.  She states she doesn't think a higher dose of lisinopril is going to help and she states she thinks it's time to do something drastic, says her BP has several points that it needs to come down.  Please advise.

## 2013-02-26 NOTE — Addendum Note (Signed)
Addended by: Eliezer Bottom on: 02/26/2013 12:51 PM   Modules accepted: Orders

## 2013-02-26 NOTE — Telephone Encounter (Signed)
Ok will add amlodipine 5 mg daily, #30 with no refills to lisinopril 40 mg daily- please send in rx to her pharmacy. Call us tomorrow with update.

## 2013-02-26 NOTE — Telephone Encounter (Signed)
She since has had multiple drug intolerances, I really do feel the best option is to increase her lisinopril to 30 mg daily if she is not having a cough.  If no improvement, we need to consider adding a second agent.

## 2013-02-26 NOTE — Telephone Encounter (Signed)
Advised patient as instructed, meds sent to her pharmacy.  She will try both of these meds and will call tomorrow or the next day with an update.

## 2013-02-26 NOTE — Telephone Encounter (Signed)
Please see notes below.  Pt has been taking 30 mg's daily for one week and has not had any improvement in blood pressure.  Please advise.

## 2013-02-26 NOTE — Telephone Encounter (Signed)
I misunderstood.  Thank you.  Let's increase Lisinopril to 40 mg daily (ok to send new rx).  Like I said, given her multiple drug intolerances, this truly is best first option.

## 2013-02-26 NOTE — Telephone Encounter (Addendum)
Pt left v/m pt has been taking Lisinopril 30 mg for 1 week; on 02/24/13 BP 179/95 and today BP 166/93; pt does not feel confident increasing Lisinopril is going to help.on 02/25/13 pt had slight h/a with dizziness BP 158/93. No h/a or dizziness today.Pt wants to know next step to get her BP under control.Please advise.[facility]. (Pt has not taken any BP med today in case Dr Dayton Martes changes med).

## 2013-02-28 ENCOUNTER — Encounter: Payer: Self-pay | Admitting: Family Medicine

## 2013-02-28 ENCOUNTER — Ambulatory Visit (INDEPENDENT_AMBULATORY_CARE_PROVIDER_SITE_OTHER): Payer: 59 | Admitting: Family Medicine

## 2013-02-28 VITALS — BP 152/90 | HR 60 | Temp 97.8°F | Wt 143.0 lb

## 2013-02-28 DIAGNOSIS — R350 Frequency of micturition: Secondary | ICD-10-CM | POA: Insufficient documentation

## 2013-02-28 DIAGNOSIS — I10 Essential (primary) hypertension: Secondary | ICD-10-CM

## 2013-02-28 DIAGNOSIS — R319 Hematuria, unspecified: Secondary | ICD-10-CM

## 2013-02-28 LAB — POCT URINALYSIS DIPSTICK
Glucose, UA: NEGATIVE
Nitrite, UA: NEGATIVE
Protein, UA: NEGATIVE
Urobilinogen, UA: NEGATIVE

## 2013-02-28 MED ORDER — CEPHALEXIN 500 MG PO CAPS: 500.0000 mg | ORAL_CAPSULE | Freq: Two times a day (BID) | ORAL | Status: DC

## 2013-02-28 NOTE — Patient Instructions (Addendum)
Good to see you. Let's continue amlodipine and lisinopril.  Take Keflex as directed- 1 tablet twice daily x 7 days.  We will call you with your culture results.

## 2013-02-28 NOTE — Progress Notes (Signed)
SUBJECTIVE:  Kristen Soto is a 57 y.o. female with PMH significant for breast CA here to follow up HTN.  She also is concerned she may have a UTI.   HTN-she called on 7/2 and ask that we  change BP medication from HCTZ to Diovan. History of cough on previous ace inhibitor.  We d/c'd her HCTZ and started Diovan.   Saw Dr. Para March on 8/12- switched back to ACEI- Lisinopril due to nausea and diarrhea.  Titrated up to Lisinopril 40 mg daily but was concerned BP remained elevated (see phone notes in Epic). We therefore added Amlodipine 5 mg daily to Lisinopril 40 mg daily on 8/19 but she only started taking it yesterday.  BP already improved.  Recurrent UTIs- followed by urology.  She wants to make sure she does not have a UTI today.  She has noticed increased frequency over last several days.  Takes macrobid post coital for prophylaxis. Took Macrobid on Monday which did improve symptoms.  Called urologist who told her to stop taking it because it can impact results.   Patient Active Problem List   Diagnosis Date Noted  . Hypokalemia 01/19/2013  . Hematuria 06/14/2011  . Lower urinary tract symptoms (LUTS) 05/25/2011  . IRREGULAR MENSES 06/25/2010  . SKIN LESION 10/28/2008  . FRACTURE, TOE, LEFT 10/28/2008  . ALLERGIC RHINITIS 10/11/2008  . HYPERTENSION 09/06/2008  . GENITAL HERPES 08/02/2008  . VITAMIN D DEFICIENCY 08/02/2008  . THYROGLOSSAL DUCT CYST 06/19/2008  . STILLBIRTH 06/19/2008  . BREAST CANCER, HX OF 06/19/2008  . OSTEOPENIA 02/27/2001   Past Medical History  Diagnosis Date  . Cancer     breast  . Hypertension    Past Surgical History  Procedure Laterality Date  . Cystoscopy  02/02/2000    mild urethral trigonitis  . Basal cell carcinoma excision  05/2008    removal, left eyelid   History  Substance Use Topics  . Smoking status: Former Games developer  . Smokeless tobacco: Not on file  . Alcohol Use: Not on file   Family History  Problem Relation Age of Onset  .  Alcohol abuse Mother   . Hypertension Mother   . Graves' disease Mother   . Heart attack Father 63  . Hypertension Father   . Hypertension Sister   . Heart attack Paternal Grandmother   . Kidney disease Paternal Grandmother   . Cancer Paternal Grandmother 86    breast  . Heart attack Paternal Grandfather   . Cancer Sister 56    breast   Allergies  Allergen Reactions  . Diovan [Valsartan]     HA, nausea  . Hctz [Hydrochlorothiazide]     Dysuria, frequency  . Lisinopril     Possible cough   Current Outpatient Prescriptions on File Prior to Visit  Medication Sig Dispense Refill  . amLODipine (NORVASC) 5 MG tablet Take 1 tablet (5 mg total) by mouth daily.  30 tablet  0  . calcium carbonate 200 MG capsule Take one by mouth daily, pt unsure of strength      . lisinopril (PRINIVIL,ZESTRIL) 40 MG tablet Take 1 tablet (40 mg total) by mouth daily.  30 tablet  0  . Multiple Vitamin (MULTIVITAMIN) tablet Take 1 tablet by mouth daily.      . raloxifene (EVISTA) 60 MG tablet Take 60 mg by mouth daily.      . valACYclovir (VALTREX) 500 MG tablet Take one tablet by mouth twice a day for 3 days at first sign of  outbreak  90 tablet  0   No current facility-administered medications on file prior to visit.   The PMH, PSH, Social History, Family History, Medications, and allergies have been reviewed in [facility], and have been updated if relevant.  OBJECTIVE: BP 152/90  Pulse 60  Temp(Src) 97.8 F (36.6 C)  Wt 143 lb (64.864 kg)  BMI 26.15 kg/m2  General:  Well-developed,well-nourished,in no acute distress; alert,appropriate and cooperative throughout examination Head:  normocephalic and atraumatic.   Eyes:  vision grossly intact, pupils equal, pupils round, and pupils reactive to light.   Ears:  R ear normal and L ear normal.   Nose:  no external deformity.   Mouth:  good dentition.   Lungs:  Normal respiratory effort, chest expands symmetrically. Lungs are clear to auscultation, no  crackles or wheezes. Heart:  Normal rate and regular rhythm. S1 and S2 normal without gallop, murmur, click, rub or other extra sounds. Abdomen:  Bowel sounds positive,abdomen soft and non-tender without masses, organomegaly or hernias noted. NO CVA tenderness Msk:  No deformity or scoliosis noted of thoracic or lumbar spine.   Extremities:  No clubbing, cyanosis, edema, or deformity noted with normal full range of motion of all joints.   Neurologic:  alert & oriented X3 and gait normal.   Skin:  Intact without suspicious lesions or rashes Psych:  Cognition and judgment appear intact. Alert and cooperative with normal attention span and concentration. No apparent delusions, illusions, hallucinations  Assessment and Plan:  1. HYPERTENSION Improving with additional CCB. She will call me next week with an update.  2. Increased frequency of urination UA positive for LE and RBC. Treat for UTI with 7 day course of Keflex and send urine for cx.

## 2013-03-02 LAB — URINE CULTURE: Organism ID, Bacteria: NO GROWTH

## 2013-03-07 ENCOUNTER — Telehealth: Payer: Self-pay | Admitting: Family Medicine

## 2013-03-07 NOTE — Telephone Encounter (Signed)
Caller: Darren/Patient; Phone: [phone]; Reason for Call: Pt started on calcium channel blocker 02/26/13, pt was asked to call and let Dr Dayton Martes know how BP was doing.  03/07/13 BP 146/89, pt states BP's have been about the same the past week.  Highest 151/91.  Pt feels fine otherwise except for an episode of a bad headache, dizziness on 03/06/13.  Pt would like to mention that she is leaving 03/10/13 for beach x 3 weeks.  Please follow up with pt and advise if Dr Dayton Martes would like to make any further changes.

## 2013-03-07 NOTE — Telephone Encounter (Signed)
Left message on voice mail asking patient to call back. 

## 2013-03-07 NOTE — Telephone Encounter (Signed)
Noted.  Please call us prior to leaving for the beach to give [place] one more update.  At this time, i would not recommend any changes to her rx.

## 2013-03-08 NOTE — Telephone Encounter (Signed)
Advised patient as instructed.  She reports reading today of 154/89.  She will call back tomorrow with another reading and will wait for instructions tomorrow.

## 2013-03-13 NOTE — Telephone Encounter (Signed)
Advised patient.  Her BP was 132/88 this morning, coming down while she's on vacation.  She will call if any changes.

## 2013-03-13 NOTE — Telephone Encounter (Signed)
If asymptomatic, this reading is ok.  Continue to monitor and keep [place] updated.

## 2013-03-13 NOTE — Telephone Encounter (Signed)
Pt called on 9/5 and reported BP reading of 145/83.  She is at the beach for the next 2.5 weeks and can be reached on her cell phone with any instructions.

## 2013-03-23 ENCOUNTER — Other Ambulatory Visit: Payer: Self-pay | Admitting: *Deleted

## 2013-03-23 MED ORDER — AMLODIPINE BESYLATE 5 MG PO TABS: 5.0000 mg | ORAL_TABLET | Freq: Every day | ORAL | Status: DC

## 2013-03-23 MED ORDER — LISINOPRIL 40 MG PO TABS: 40.0000 mg | ORAL_TABLET | Freq: Every day | ORAL | Status: DC

## 2013-03-26 ENCOUNTER — Other Ambulatory Visit: Payer: Self-pay | Admitting: *Deleted

## 2013-03-26 MED ORDER — AMLODIPINE BESYLATE 5 MG PO TABS: 5.0000 mg | ORAL_TABLET | Freq: Every day | ORAL | Status: DC

## 2013-03-26 MED ORDER — LISINOPRIL 40 MG PO TABS: 40.0000 mg | ORAL_TABLET | Freq: Every day | ORAL | Status: DC

## 2013-04-02 ENCOUNTER — Other Ambulatory Visit: Payer: Self-pay | Admitting: *Deleted

## 2013-05-03 ENCOUNTER — Encounter: Payer: Self-pay | Admitting: Family Medicine

## 2013-05-10 ENCOUNTER — Other Ambulatory Visit: Payer: Self-pay

## 2013-06-12 ENCOUNTER — Other Ambulatory Visit: Payer: Self-pay | Admitting: Internal Medicine

## 2013-06-12 ENCOUNTER — Other Ambulatory Visit (INDEPENDENT_AMBULATORY_CARE_PROVIDER_SITE_OTHER): Payer: 59

## 2013-06-12 DIAGNOSIS — Z0000 Encounter for general adult medical examination without abnormal findings: Secondary | ICD-10-CM

## 2013-06-12 LAB — COMPREHENSIVE METABOLIC PANEL
ALT: 25 U/L (ref 0–35)
AST: 26 U/L (ref 0–37)
Albumin: 4.4 g/dL (ref 3.5–5.2)
Alkaline Phosphatase: 87 U/L (ref 39–117)
GFR: 70.48 mL/min (ref >60.00)
Glucose, Bld: 96 mg/dL (ref 70–99)
Potassium: 3.8 mEq/L (ref 3.5–5.1)
Sodium: 136 mEq/L (ref 135–145)
Total Bilirubin: 0.7 mg/dL (ref 0.3–1.2)
Total Protein: 7.3 g/dL (ref 6.0–8.3)

## 2013-06-12 LAB — CBC
HCT: 41 % (ref 36.0–46.0)
MCHC: 34.2 g/dL (ref 30.0–36.0)
MCV: 91 fl (ref 78.0–100.0)
RBC: 4.5 Mil/uL (ref 3.87–5.11)
RDW: 12.9 % (ref 11.5–14.6)

## 2013-06-12 LAB — TSH: TSH: 1.24 u[IU]/mL (ref 0.35–5.50)

## 2013-06-12 LAB — LIPID PANEL
Cholesterol: 210 mg/dL — ABNORMAL HIGH (ref 0–200)
Total CHOL/HDL Ratio: 3
VLDL: 16.2 mg/dL (ref 0.0–40.0)

## 2013-06-12 LAB — LDL CHOLESTEROL, DIRECT: Direct LDL: 117.3 mg/dL

## 2013-06-14 ENCOUNTER — Ambulatory Visit (INDEPENDENT_AMBULATORY_CARE_PROVIDER_SITE_OTHER): Payer: 59 | Admitting: Internal Medicine

## 2013-06-14 ENCOUNTER — Encounter: Payer: Self-pay | Admitting: Internal Medicine

## 2013-06-14 VITALS — BP 110/70 | HR 72 | Temp 98.5°F | Ht 62.0 in | Wt 143.0 lb

## 2013-06-14 DIAGNOSIS — I10 Essential (primary) hypertension: Secondary | ICD-10-CM

## 2013-06-14 DIAGNOSIS — Z0000 Encounter for general adult medical examination without abnormal findings: Secondary | ICD-10-CM

## 2013-06-14 DIAGNOSIS — A6000 Herpesviral infection of urogenital system, unspecified: Secondary | ICD-10-CM

## 2013-06-14 NOTE — Assessment & Plan Note (Signed)
Current outbreak She does not want to use Valtrex She is in a monogamous relationship

## 2013-06-14 NOTE — Progress Notes (Signed)
HPI  Pt presents to the clinic today for her annual exam. She does have some concerns today about a lump on her right labia. She does have genital herpes and thinks she may be having an outbreak but would like to have it checked to make sure.  Flu: never Tetanus: 2014 Pap Smear: 11/2012 Mammogram: 11/2012 Colonoscopy: 2008 Eye Doctor: yearly Dentist: biannually Bone Density: 2012  Past Medical History  Diagnosis Date  . Cancer     breast  . Hypertension     Current Outpatient Prescriptions  Medication Sig Dispense Refill  . amLODipine (NORVASC) 5 MG tablet Take 1 tablet (5 mg total) by mouth daily.  90 tablet  1  . calcium carbonate 200 MG capsule Take one by mouth daily, pt unsure of strength      . lisinopril (PRINIVIL,ZESTRIL) 40 MG tablet Take 1 tablet (40 mg total) by mouth daily.  90 tablet  1  . Multiple Vitamin (MULTIVITAMIN) tablet Take 1 tablet by mouth daily.      . raloxifene (EVISTA) 60 MG tablet Take 60 mg by mouth daily.      . valACYclovir (VALTREX) 500 MG tablet Take one tablet by mouth twice a day for 3 days at first sign of outbreak  90 tablet  0   No current facility-administered medications for this visit.    Allergies  Allergen Reactions  . Diovan [Valsartan]     HA, nausea  . Hctz [Hydrochlorothiazide]     Dysuria, frequency  . Lisinopril     Possible cough    Family History  Problem Relation Age of Onset  . Alcohol abuse Mother   . Hypertension Mother   . Graves' disease Mother   . Heart attack Father 46  . Hypertension Father   . Hypertension Sister   . Heart attack Paternal Grandmother   . Kidney disease Paternal Grandmother   . Cancer Paternal Grandmother 4    breast  . Heart attack Paternal Grandfather   . Cancer Sister 59    breast    History   Social History  . Marital Status: Married    Spouse Name: N/A    Number of Children: 1  . Years of Education: N/A   Occupational History  . PROJECT MANAGEN    Social History Main  Topics  . Smoking status: Former Games developer  . Smokeless tobacco: Not on file  . Alcohol Use: Not on file  . Drug Use: Not on file  . Sexual Activity: Not on file   Other Topics Concern  . Not on file   Social History Narrative  . No narrative on file    ROS:  Constitutional: Denies fever, malaise, fatigue, headache or abrupt weight changes.  HEENT: Denies eye pain, eye redness, ear pain, ringing in the ears, wax buildup, runny nose, nasal congestion, bloody nose, or sore throat. Respiratory: Denies difficulty breathing, shortness of breath, cough or sputum production.   Cardiovascular: Denies chest pain, chest tightness, palpitations or swelling in the hands or feet.  Gastrointestinal: Denies abdominal pain, bloating, constipation, diarrhea or blood in the stool.  GU: Denies frequency, urgency, pain with urination, blood in urine, odor or discharge. Musculoskeletal: Denies decrease in range of motion, difficulty with gait, muscle pain or joint pain and swelling.  Skin: Denies redness, rashes, lesions or ulcercations.  Neurological: Denies dizziness, difficulty with memory, difficulty with speech or problems with balance and coordination.   No other specific complaints in a complete review of systems (except  as listed in HPI above).  PE:  BP 110/70  Pulse 72  Temp(Src) 98.5 F (36.9 C) (Tympanic)  Ht 5\' 2"  (1.575 m)  Wt 143 lb (64.864 kg)  BMI 26.15 kg/m2  SpO2 98% Wt Readings from Last 3 Encounters:  06/14/13 143 lb (64.864 kg)  02/28/13 143 lb (64.864 kg)  02/13/13 143 lb (64.864 kg)    General: Appears her stated age, well developed, well nourished in NAD. HEENT: Head: normal shape and size; Eyes: sclera white, no icterus, conjunctiva pink, PERRLA and EOMs intact; Ears: Tm's gray and intact, normal light reflex; Nose: mucosa pink and moist, septum midline; Throat/Mouth: Teeth present, mucosa pink and moist, no lesions or ulcerations noted.  Neck: Normal range of motion.  Neck supple, trachea midline. No massses, lumps or thyromegaly present.  Cardiovascular: Normal rate and rhythm. S1,S2 noted.  No murmur, rubs or gallops noted. No JVD or BLE edema. No carotid bruits noted. Pulmonary/Chest: Normal effort and positive vesicular breath sounds. No respiratory distress. No wheezes, rales or ronchi noted.  Abdomen: Soft and nontender. Normal bowel sounds, no bruits noted. No distention or masses noted. Liver, spleen and kidneys non palpable. Pelvic exam reveals small labial lesion on right labia, slightly ulcerated. Musculoskeletal: Normal range of motion. No signs of joint swelling. No difficulty with gait.  Neurological: Alert and oriented. Cranial nerves II-XII intact. Coordination normal. +DTRs bilaterally. Psychiatric: Mood and affect normal. Behavior is normal. Judgment and thought content normal.     BMET    Component Value Date/Time   NA 136 06/12/2013 1201   K 3.8 06/12/2013 1201   CL 103 06/12/2013 1201   CO2 25 06/12/2013 1201   GLUCOSE 96 06/12/2013 1201   BUN 14 06/12/2013 1201   CREATININE 0.9 06/12/2013 1201   CREATININE 0.78 01/19/2013 1619   CALCIUM 9.2 06/12/2013 1201   GFRNONAA 66.90 04/28/2010 0854   GFRAA 97 07/26/2008 1123    Lipid Panel     Component Value Date/Time   CHOL 210* 06/12/2013 1201   TRIG 81.0 06/12/2013 1201   HDL 70.80 06/12/2013 1201   CHOLHDL 3 06/12/2013 1201   VLDL 16.2 06/12/2013 1201   LDLCALC 78 07/26/2008 1123    CBC    Component Value Date/Time   WBC 5.9 06/12/2013 1201   RBC 4.50 06/12/2013 1201   HGB 14.0 06/12/2013 1201   HCT 41.0 06/12/2013 1201   PLT 233.0 06/12/2013 1201   MCV 91.0 06/12/2013 1201   MCH 30.6 01/19/2013 1619   MCHC 34.2 06/12/2013 1201   RDW 12.9 06/12/2013 1201   LYMPHSABS 1.0 01/19/2013 1619   MONOABS 0.3 01/19/2013 1619   EOSABS 0.1 01/19/2013 1619   BASOSABS 0.0 01/19/2013 1619    Hgb A1C No results found for this basename: HGBA1C     Assessment and Plan:  Preventative Health  Maintenance:  Pt declines flu shot Lab work reviewed and essentially normal She would like to start having her paps done here and mammograms at [place]- we will schedule when it is time  RTC in 1 year or sooner if needed

## 2013-06-14 NOTE — Progress Notes (Signed)
Pre-visit discussion using our clinic review tool. No additional management support is needed unless otherwise documented below in the visit note.  

## 2013-06-14 NOTE — Assessment & Plan Note (Signed)
She is concerned about BP being too low Will hold amlodipine x 2 weeks If stable will continue with lisinopril

## 2013-06-14 NOTE — Patient Instructions (Signed)

## 2013-09-20 ENCOUNTER — Telehealth: Payer: Self-pay | Admitting: Family Medicine

## 2013-09-20 ENCOUNTER — Encounter: Payer: Self-pay | Admitting: Internal Medicine

## 2013-09-20 ENCOUNTER — Ambulatory Visit (INDEPENDENT_AMBULATORY_CARE_PROVIDER_SITE_OTHER): Payer: 59 | Admitting: Internal Medicine

## 2013-09-20 VITALS — BP 130/74 | HR 87 | Temp 98.7°F | Wt 140.0 lb

## 2013-09-20 DIAGNOSIS — R05 Cough: Secondary | ICD-10-CM

## 2013-09-20 DIAGNOSIS — R509 Fever, unspecified: Secondary | ICD-10-CM

## 2013-09-20 DIAGNOSIS — Z853 Personal history of malignant neoplasm of breast: Secondary | ICD-10-CM

## 2013-09-20 DIAGNOSIS — J029 Acute pharyngitis, unspecified: Secondary | ICD-10-CM

## 2013-09-20 DIAGNOSIS — R350 Frequency of micturition: Secondary | ICD-10-CM

## 2013-09-20 DIAGNOSIS — R059 Cough, unspecified: Secondary | ICD-10-CM

## 2013-09-20 DIAGNOSIS — E079 Disorder of thyroid, unspecified: Secondary | ICD-10-CM

## 2013-09-20 LAB — POCT URINALYSIS DIPSTICK
Bilirubin, UA: NEGATIVE
GLUCOSE UA: NEGATIVE
Ketones, UA: NEGATIVE
Leukocytes, UA: NEGATIVE
NITRITE UA: NEGATIVE
Protein, UA: NEGATIVE
Urobilinogen, UA: NEGATIVE
pH, UA: 6

## 2013-09-20 NOTE — Telephone Encounter (Signed)
Referral placed.

## 2013-09-20 NOTE — Addendum Note (Signed)
Addended by: Lurlean Nanny on: 09/20/2013 04:57 PM   Modules accepted: Orders

## 2013-09-20 NOTE — Progress Notes (Signed)
Subjective:    Patient ID: Kristen Soto, female    DOB: 1955-11-28, 58 y.o.   MRN: [id number]  HPI  Pt presents to the clinic today with c/o sore throat, fever and cough. She reports this started yesterday. The cough is dry and unproductive. She feels like there is a "lump" in her throat. She has had a fever orf 100.5. She does have a history of allergy but denies breathing problems. She has not taken anything OTC. Additionally, she c/o urinary frequency. She reports this started yesterday. She denies dysuria, flank pain or nausea. She does have a history of urological problems.  Review of Systems      Past Medical History  Diagnosis Date  . Cancer     breast  . Hypertension     Current Outpatient Prescriptions  Medication Sig Dispense Refill  . amLODipine (NORVASC) 5 MG tablet Take 1 tablet (5 mg total) by mouth daily.  90 tablet  1  . calcium carbonate (TUMS - DOSED IN MG ELEMENTAL CALCIUM) 500 MG chewable tablet Chew 1 tablet by mouth daily.      Marland Kitchen lisinopril (PRINIVIL,ZESTRIL) 40 MG tablet Take 1 tablet (40 mg total) by mouth daily.  90 tablet  1  . Multiple Vitamin (MULTIVITAMIN) tablet Take 1 tablet by mouth daily.      . nitrofurantoin (MACRODANTIN) 100 MG capsule Take 100 mg by mouth as needed. To be taken 1 hour before intercourse      . raloxifene (EVISTA) 60 MG tablet Take 60 mg by mouth daily.       No current facility-administered medications for this visit.    Allergies  Allergen Reactions  . Diovan [Valsartan]     HA, nausea  . Hctz [Hydrochlorothiazide]     Dysuria, frequency  . Lisinopril     Possible cough    Family History  Problem Relation Age of Onset  . Alcohol abuse Mother   . Hypertension Mother   . Graves' disease Mother   . Heart attack Father 97  . Hypertension Father   . Hypertension Sister   . Heart attack Paternal Grandmother   . Kidney disease Paternal Grandmother   . Cancer Paternal Grandmother 42    breast  . Heart attack  Paternal Grandfather   . Cancer Sister 9    breast    History   Social History  . Marital Status: Married    Spouse Name: N/A    Number of Children: 1  . Years of Education: N/A   Occupational History  . PROJECT MANAGEN    Social History Main Topics  . Smoking status: Former Research scientist (life sciences)  . Smokeless tobacco: Not on file  . Alcohol Use: Not on file  . Drug Use: Not on file  . Sexual Activity: Not on file   Other Topics Concern  . Not on file   Social History Narrative  . No narrative on file     Constitutional: Pt reports fever. Denies malaise, fatigue, headache or abrupt weight changes.  HEENT: Pt reports sore throat.  Denies eye pain, eye redness, ear pain, ringing in the ears, wax buildup, runny nose, nasal congestion, bloody noset. Respiratory: Pt reports cough. Denies difficulty breathing, shortness of breath, or sputum production.   Cardiovascular: Denies chest pain, chest tightness, palpitations or swelling in the hands or feet.  GU: Pt reports frequency. Denies urgency, pain with urination, burning sensation, blood in urine, odor or discharge.   No other specific complaints in a  complete review of systems (except as listed in HPI above).  Objective:   Physical Exam  BP 130/74  Pulse 87  Temp(Src) 98.7 F (37.1 C) (Oral)  Wt 140 lb (63.504 kg)  SpO2 98% Wt Readings from Last 3 Encounters:  09/20/13 140 lb (63.504 kg)  06/14/13 143 lb (64.864 kg)  02/28/13 143 lb (64.864 kg)    General: Appears her stated age, well developed, well nourished in NAD. HEENT: Head: normal shape and size; Eyes: sclera white, no icterus, conjunctiva pink, PERRLA and EOMs intact; Ears: Left cerumen impaction; Nose: mucosa pink and moist, septum midline; Throat/Mouth: Teeth present, mucosa pink and moist, no exudate, lesions or ulcerations noted. Neck: Nickel sized mass noted on left thyroid lobe.  Cardiovascular: Normal rate and rhythm. S1,S2 noted.  No murmur, rubs or gallops  noted. No JVD or BLE edema. No carotid bruits noted. Pulmonary/Chest: Normal effort and positive vesicular breath sounds. No respiratory distress. No wheezes, rales or ronchi noted.  Abdomen: Soft and nontender. Normal bowel sounds, no bruits noted. No distention or masses noted. Liver, spleen and kidneys non palpable. No CVA tenderness.   BMET    Component Value Date/Time   NA 136 06/12/2013 1201   K 3.8 06/12/2013 1201   CL 103 06/12/2013 1201   CO2 25 06/12/2013 1201   GLUCOSE 96 06/12/2013 1201   BUN 14 06/12/2013 1201   CREATININE 0.9 06/12/2013 1201   CREATININE 0.78 01/19/2013 1619   CALCIUM 9.2 06/12/2013 1201   GFRNONAA 66.90 04/28/2010 0854   GFRAA 97 07/26/2008 1123    Lipid Panel     Component Value Date/Time   CHOL 210* 06/12/2013 1201   TRIG 81.0 06/12/2013 1201   HDL 70.80 06/12/2013 1201   CHOLHDL 3 06/12/2013 1201   VLDL 16.2 06/12/2013 1201   LDLCALC 78 07/26/2008 1123    CBC    Component Value Date/Time   WBC 5.9 06/12/2013 1201   RBC 4.50 06/12/2013 1201   HGB 14.0 06/12/2013 1201   HCT 41.0 06/12/2013 1201   PLT 233.0 06/12/2013 1201   MCV 91.0 06/12/2013 1201   MCH 30.6 01/19/2013 1619   MCHC 34.2 06/12/2013 1201   RDW 12.9 06/12/2013 1201   LYMPHSABS 1.0 01/19/2013 1619   MONOABS 0.3 01/19/2013 1619   EOSABS 0.1 01/19/2013 1619   BASOSABS 0.0 01/19/2013 1619    Hgb A1C No results found for this basename: HGBA1C         Assessment & Plan:   Fever, sore throat and cough secondary to viral URI:  Supportive care at this time Allegra and Delsym OTC Ibuprofen for pain/fever  Lump on thyroid:  Will obtain ultrasound of thyroid at this time Last TSH 06/2013 reviewed  Urinary frequency:  Urinalysis: small blood Increase your fluid intake  RTC as needed or if symptoms persist or worsen

## 2013-09-20 NOTE — Progress Notes (Signed)
Pre visit review using our clinic review tool, if applicable. No additional management support is needed unless otherwise documented below in the visit note. 

## 2013-09-20 NOTE — Telephone Encounter (Signed)
Pt wants to have a dx mammogram ordered. Says she usually goes to [facility] to have it done but wants you to order it this time. Says she is due after August 1st. Please advise Thank you!

## 2013-09-20 NOTE — Patient Instructions (Addendum)

## 2013-09-25 ENCOUNTER — Encounter: Payer: Self-pay | Admitting: Internal Medicine

## 2013-09-25 ENCOUNTER — Ambulatory Visit: Payer: Self-pay | Admitting: Internal Medicine

## 2013-09-29 ENCOUNTER — Encounter: Payer: Self-pay | Admitting: Family Medicine

## 2013-10-01 ENCOUNTER — Other Ambulatory Visit: Payer: Self-pay | Admitting: Internal Medicine

## 2013-10-01 DIAGNOSIS — E042 Nontoxic multinodular goiter: Secondary | ICD-10-CM

## 2013-10-05 ENCOUNTER — Ambulatory Visit: Payer: Self-pay | Admitting: Internal Medicine

## 2013-10-08 ENCOUNTER — Encounter: Payer: Self-pay | Admitting: Internal Medicine

## 2013-10-11 ENCOUNTER — Telehealth: Payer: Self-pay | Admitting: Family Medicine

## 2013-10-11 DIAGNOSIS — E041 Nontoxic single thyroid nodule: Secondary | ICD-10-CM

## 2013-10-11 NOTE — Telephone Encounter (Signed)
Patient is calling to ask if we have her Biopsy results that she had last Friday 10/05/13 at [facility]. Please call the patient with her results at Home# [phone].

## 2013-10-11 NOTE — Telephone Encounter (Signed)
Discussed results with pt- inconclusive. 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  Will refer to ENT at [facility] because that is where all of her cancer records are.  She would like appointment before next Wednesday since she is leaving town and I told her we would try but could not promise anything.

## 2013-10-12 NOTE — Telephone Encounter (Signed)
REferral faxed to [facility] ENT Dept, they will review and contact the pt directly, patient will call us when she is contacted. We will fu as well.

## 2013-10-17 ENCOUNTER — Encounter: Payer: Self-pay | Admitting: Internal Medicine

## 2013-11-21 ENCOUNTER — Telehealth: Payer: Self-pay | Admitting: Family Medicine

## 2013-11-21 DIAGNOSIS — M899 Disorder of bone, unspecified: Secondary | ICD-10-CM

## 2013-11-21 DIAGNOSIS — M949 Disorder of cartilage, unspecified: Principal | ICD-10-CM

## 2013-11-21 NOTE — Telephone Encounter (Signed)
Pt would like a bone density referral to [place] (phone: [phone] fax: [phone]) after December 20th, 2015.  She wants apptmt this year.  Thank you.

## 2013-11-21 NOTE — Telephone Encounter (Signed)
Referral placed.

## 2014-01-03 ENCOUNTER — Telehealth: Payer: Self-pay

## 2014-01-03 NOTE — Telephone Encounter (Signed)
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 Pt wants Dr Elonda Husky advise if should get Hep B vaccination; cannot find immunization record verifying pt has already had Hep B. Pt said can get immunization at [facility]/ pt is aware will be next week for cb.

## 2014-01-07 NOTE — Telephone Encounter (Signed)
It is her choice whether or not to get it done but I think it's a good idea.

## 2014-01-07 NOTE — Telephone Encounter (Signed)
Left detailed message on pts vm, as requested, advising per Dr Deborra Medina

## 2014-01-08 NOTE — Telephone Encounter (Signed)
Pt left v/m requesting copy of immunization report; spoke with pts husband and advised immun. Report at front desk for pick up.

## 2014-01-11 ENCOUNTER — Encounter: Payer: Self-pay | Admitting: Family Medicine

## 2014-02-19 ENCOUNTER — Other Ambulatory Visit: Payer: Self-pay | Admitting: Family Medicine

## 2014-02-20 ENCOUNTER — Telehealth: Payer: Self-pay | Admitting: *Deleted

## 2014-02-20 MED ORDER — RALOXIFENE HCL 60 MG PO TABS: 60.0000 mg | ORAL_TABLET | Freq: Every day | ORAL | Status: DC

## 2014-02-25 ENCOUNTER — Telehealth: Payer: Self-pay

## 2014-02-25 NOTE — Telephone Encounter (Signed)
Lm on pts vm informing her Rx was sent in to pharmacy 02/20/14

## 2014-02-25 NOTE — Telephone Encounter (Signed)
Pt left v/m requesting cb re; status of evisa refill.

## 2014-02-25 NOTE — Telephone Encounter (Signed)
Pt only wants [facility] as pharmacy; advised done.

## 2014-02-26 ENCOUNTER — Other Ambulatory Visit: Payer: Self-pay | Admitting: *Deleted

## 2014-02-26 MED ORDER — LISINOPRIL 40 MG PO TABS: 40.0000 mg | ORAL_TABLET | Freq: Every day | ORAL | Status: DC

## 2014-04-26 ENCOUNTER — Other Ambulatory Visit: Payer: Self-pay | Admitting: Family Medicine

## 2014-04-29 ENCOUNTER — Other Ambulatory Visit: Payer: Self-pay | Admitting: *Deleted

## 2014-04-29 MED ORDER — RALOXIFENE HCL 60 MG PO TABS: 60.0000 mg | ORAL_TABLET | Freq: Every day | ORAL | Status: DC

## 2014-04-29 MED ORDER — AMLODIPINE BESYLATE 5 MG PO TABS: ORAL_TABLET | ORAL | Status: DC

## 2014-06-04 ENCOUNTER — Other Ambulatory Visit: Payer: Self-pay | Admitting: Family Medicine

## 2014-06-04 DIAGNOSIS — Z01419 Encounter for gynecological examination (general) (routine) without abnormal findings: Secondary | ICD-10-CM

## 2014-06-04 DIAGNOSIS — E559 Vitamin D deficiency, unspecified: Secondary | ICD-10-CM

## 2014-06-10 ENCOUNTER — Other Ambulatory Visit (INDEPENDENT_AMBULATORY_CARE_PROVIDER_SITE_OTHER): Payer: 59

## 2014-06-10 DIAGNOSIS — Z0000 Encounter for general adult medical examination without abnormal findings: Secondary | ICD-10-CM

## 2014-06-10 DIAGNOSIS — E559 Vitamin D deficiency, unspecified: Secondary | ICD-10-CM

## 2014-06-10 DIAGNOSIS — Z01419 Encounter for gynecological examination (general) (routine) without abnormal findings: Secondary | ICD-10-CM

## 2014-06-10 LAB — CBC WITH DIFFERENTIAL/PLATELET
BASOS PCT: 0.8 % (ref 0.0–3.0)
Basophils Absolute: 0 10*3/uL (ref 0.0–0.1)
EOS PCT: 1.8 % (ref 0.0–5.0)
Eosinophils Absolute: 0.1 10*3/uL (ref 0.0–0.7)
HEMATOCRIT: 40.4 % (ref 36.0–46.0)
Hemoglobin: 13.6 g/dL (ref 12.0–15.0)
LYMPHS ABS: 1.6 10*3/uL (ref 0.7–4.0)
Lymphocytes Relative: 31.2 % (ref 12.0–46.0)
MCHC: 33.6 g/dL (ref 30.0–36.0)
MCV: 92.4 fl (ref 78.0–100.0)
MONO ABS: 0.3 10*3/uL (ref 0.1–1.0)
Monocytes Relative: 5.9 % (ref 3.0–12.0)
NEUTROS ABS: 3 10*3/uL (ref 1.4–7.7)
Neutrophils Relative %: 60.3 % (ref 43.0–77.0)
Platelets: 219 10*3/uL (ref 150.0–400.0)
RBC: 4.38 Mil/uL (ref 3.87–5.11)
RDW: 12.9 % (ref 11.5–15.5)
WBC: 5 10*3/uL (ref 4.0–10.5)

## 2014-06-10 LAB — LIPID PANEL
Cholesterol: 216 mg/dL — ABNORMAL HIGH (ref 0–200)
HDL: 73.8 mg/dL (ref >39.00)
LDL Cholesterol: 125 mg/dL — ABNORMAL HIGH (ref 0–99)
NonHDL: 142.2
TRIGLYCERIDES: 87 mg/dL (ref 0.0–149.0)
Total CHOL/HDL Ratio: 3
VLDL: 17.4 mg/dL (ref 0.0–40.0)

## 2014-06-10 LAB — COMPREHENSIVE METABOLIC PANEL
ALBUMIN: 4.2 g/dL (ref 3.5–5.2)
ALK PHOS: 79 U/L (ref 39–117)
ALT: 18 U/L (ref 0–35)
AST: 23 U/L (ref 0–37)
BILIRUBIN TOTAL: 0.7 mg/dL (ref 0.2–1.2)
BUN: 18 mg/dL (ref 6–23)
CO2: 26 mEq/L (ref 19–32)
CREATININE: 0.8 mg/dL (ref 0.4–1.2)
Calcium: 9.1 mg/dL (ref 8.4–10.5)
Chloride: 108 mEq/L (ref 96–112)
GFR: 76.2 mL/min (ref >60.00)
GLUCOSE: 86 mg/dL (ref 70–99)
Potassium: 4.2 mEq/L (ref 3.5–5.1)
Sodium: 140 mEq/L (ref 135–145)
Total Protein: 6.5 g/dL (ref 6.0–8.3)

## 2014-06-10 LAB — VITAMIN D 25 HYDROXY (VIT D DEFICIENCY, FRACTURES): VITD: 39.73 ng/mL (ref 30.00–100.00)

## 2014-06-10 LAB — TSH: TSH: 1.48 u[IU]/mL (ref 0.35–4.50)

## 2014-06-16 ENCOUNTER — Other Ambulatory Visit: Payer: Self-pay | Admitting: Family Medicine

## 2014-06-17 ENCOUNTER — Other Ambulatory Visit: Payer: Self-pay | Admitting: *Deleted

## 2014-06-17 ENCOUNTER — Encounter: Payer: Self-pay | Admitting: Family Medicine

## 2014-06-17 ENCOUNTER — Ambulatory Visit (INDEPENDENT_AMBULATORY_CARE_PROVIDER_SITE_OTHER): Payer: 59 | Admitting: Family Medicine

## 2014-06-17 ENCOUNTER — Other Ambulatory Visit (HOSPITAL_COMMUNITY)
Admission: RE | Admit: 2014-06-17 | Discharge: 2014-06-17 | Disposition: A | Discharge status: Home / Self Care | Payer: 59 | Source: Ambulatory Visit | Attending: Family Medicine | Admitting: Family Medicine

## 2014-06-17 VITALS — BP 136/82 | HR 62 | Temp 97.9°F | Ht 62.0 in | Wt 136.5 lb

## 2014-06-17 DIAGNOSIS — R300 Dysuria: Secondary | ICD-10-CM

## 2014-06-17 DIAGNOSIS — Z1151 Encounter for screening for human papillomavirus (HPV): Secondary | ICD-10-CM | POA: Diagnosis present

## 2014-06-17 DIAGNOSIS — I10 Essential (primary) hypertension: Secondary | ICD-10-CM

## 2014-06-17 DIAGNOSIS — Z0000 Encounter for general adult medical examination without abnormal findings: Secondary | ICD-10-CM | POA: Insufficient documentation

## 2014-06-17 DIAGNOSIS — N390 Urinary tract infection, site not specified: Secondary | ICD-10-CM

## 2014-06-17 DIAGNOSIS — Q892 Congenital malformations of other endocrine glands: Secondary | ICD-10-CM

## 2014-06-17 DIAGNOSIS — Z01419 Encounter for gynecological examination (general) (routine) without abnormal findings: Secondary | ICD-10-CM

## 2014-06-17 DIAGNOSIS — M949 Disorder of cartilage, unspecified: Secondary | ICD-10-CM

## 2014-06-17 DIAGNOSIS — M899 Disorder of bone, unspecified: Secondary | ICD-10-CM

## 2014-06-17 DIAGNOSIS — Z01411 Encounter for gynecological examination (general) (routine) with abnormal findings: Secondary | ICD-10-CM | POA: Diagnosis not present

## 2014-06-17 DIAGNOSIS — Z853 Personal history of malignant neoplasm of breast: Secondary | ICD-10-CM

## 2014-06-17 LAB — POCT URINALYSIS DIPSTICK
Bilirubin, UA: NEGATIVE
Blood, UA: NEGATIVE
Glucose, UA: NEGATIVE
KETONES UA: NEGATIVE
Nitrite, UA: NEGATIVE
PROTEIN UA: NEGATIVE
Urobilinogen, UA: 0.2
pH, UA: 6

## 2014-06-17 MED ORDER — CIPROFLOXACIN HCL 500 MG PO TABS: 500.0000 mg | ORAL_TABLET | Freq: Two times a day (BID) | ORAL | Status: AC

## 2014-06-17 MED ORDER — AMLODIPINE BESYLATE 5 MG PO TABS: ORAL_TABLET | ORAL | Status: DC

## 2014-06-17 MED ORDER — RALOXIFENE HCL 60 MG PO TABS: 60.0000 mg | ORAL_TABLET | Freq: Every day | ORAL | Status: DC

## 2014-06-17 NOTE — Assessment & Plan Note (Signed)
UA pos for LE. Will treat for UTI empirically with cipro 500 mg twice daily x 5 days. Send urine for cx.

## 2014-06-17 NOTE — Progress Notes (Signed)
Pt presents to the clinic today for her annual exam.  Saw Webb Silversmith last year for CPX while I was on maternity leave.  She does have concerns that she may have a UTI.  H/o recurrent UTIs, saw Dr. Rogers Blocker who recommended she take Macrobid post coital but [place] the bottle at the beach.  Feels like she has constant increased urinary frequency and urgency, with increased symptoms over past few days.  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  She did follow up last month but we did not receive those records.  Per pt, did not change- recommended yearly follow up ultrasounds.  Osteopenia- has been on evista for a couple of years, DEXA scheduled for Monday.  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 Mammogram: 01/10/14 Colonoscopy: 2012 Eye Doctor: yearly Dentist: biannually Bone Density: 2012- bone density scan in next Monday  Current Outpatient Prescriptions on File Prior to Visit  Medication Sig Dispense Refill  . amLODipine (NORVASC) 5 MG tablet TAKE 1 TABLET DAILY 90 tablet 0  . calcium carbonate (TUMS - DOSED IN MG ELEMENTAL CALCIUM) 500 MG chewable tablet Chew 1 tablet by mouth daily.    Marland Kitchen lisinopril (PRINIVIL,ZESTRIL) 40 MG tablet TAKE 1 TABLET DAILY 90 tablet 0  . Multiple Vitamin (MULTIVITAMIN) tablet Take 1 tablet by mouth daily.    . raloxifene (EVISTA) 60 MG tablet Take 1 tablet (60 mg total) by mouth daily. 90 tablet 0   No current facility-administered medications on file prior to visit.    Allergies  Allergen Reactions  . Diovan [Valsartan]     HA, nausea  . Hctz [Hydrochlorothiazide]     Dysuria, frequency  . Lisinopril     Possible cough    Past Medical History  Diagnosis Date  . Cancer     breast  .  Hypertension     Past Surgical History  Procedure Laterality Date  . Cystoscopy  02/02/2000    mild urethral trigonitis  . Basal cell carcinoma excision  05/2008    removal, left eyelid    Family History  Problem Relation Age of Onset  . Alcohol abuse Mother   . Hypertension Mother   . Graves' disease Mother   . Heart attack Father 51  . Hypertension Father   . Hypertension Sister   . Heart attack Paternal Grandmother   . Kidney disease Paternal Grandmother   . Cancer Paternal Grandmother 97    breast  . Heart attack Paternal Grandfather   . Cancer Sister 53    breast    History   Social History  . Marital Status: Married    Spouse Name: N/A    Number of Children: 1  . Years of Education: N/A   Occupational History  . PROJECT MANAGEN    Social History Main Topics  . Smoking status: Former Research scientist (life sciences)  . Smokeless tobacco: Not on file  . Alcohol Use: Not on file  . Drug Use: Not on file  . Sexual Activity: Not on file   Other Topics Concern  . Not on file   Social History Narrative   The PMH, PSH, Social History, Family History, Medications, and allergies have been reviewed in [facility], and have been updated if relevant.  Lab  Results  Component Value Date   WBC 5.0 06/10/2014   HGB 13.6 06/10/2014   HCT 40.4 06/10/2014   MCV 92.4 06/10/2014   PLT 219.0 06/10/2014   Lab Results  Component Value Date   CHOL 216* 06/10/2014   HDL 73.80 06/10/2014   LDLCALC 125* 06/10/2014   LDLDIRECT 117.3 06/12/2013   TRIG 87.0 06/10/2014   CHOLHDL 3 06/10/2014   Lab Results  Component Value Date   CREATININE 0.8 06/10/2014   Lab Results  Component Value Date   TSH 1.48 06/10/2014    ROS:  Constitutional: Denies fever, malaise, fatigue, headache or abrupt weight changes.  HEENT: Denies eye pain, eye redness, ear pain, ringing in the ears, wax buildup, runny nose, nasal congestion, bloody nose, or sore throat. Respiratory: Denies difficulty breathing, shortness of  breath, cough or sputum production.   Cardiovascular: Denies chest pain, chest tightness, palpitations or swelling in the hands or feet.  Gastrointestinal: Denies abdominal pain, bloating, constipation, diarrhea or blood in the stool.  GU: admits to ufrequency, urgency, pain with urination, denies blood in urine, odor or discharge. Musculoskeletal: Denies decrease in range of motion, difficulty with gait, muscle pain or joint pain and swelling.  Skin: Denies redness, rashes, lesions or ulcercations.  Neurological: Denies dizziness, difficulty with memory, difficulty with speech or problems with balance and coordination.   No other specific complaints in a complete review of systems (except as listed in HPI above).  PE:  BP 136/82 mmHg  Pulse 62  Temp(Src) 97.9 F (36.6 C) (Oral)  Ht 5\' 2"  (1.575 m)  Wt 136 lb 8 oz (61.916 kg)  BMI 24.96 kg/m2  SpO2 99% Wt Readings from Last 3 Encounters:  06/17/14 136 lb 8 oz (61.916 kg)  09/20/13 140 lb (63.504 kg)  06/14/13 143 lb (64.864 kg)    General:  Well-developed,well-nourished,in no acute distress; alert,appropriate and cooperative throughout examination Head:  normocephalic and atraumatic.   Eyes:  vision grossly intact, pupils equal, pupils round, and pupils reactive to light.   Ears:  R ear normal and L ear normal.   Nose:  no external deformity.   Mouth:  good dentition.   Neck:  No deformities, masses, or tenderness noted. Breasts:  No mass, nodules, thickening, tenderness, bulging, retraction, inflamation, nipple discharge or skin changes noted.   Lungs:  Normal respiratory effort, chest expands symmetrically. Lungs are clear to auscultation, no crackles or wheezes. Heart:  Normal rate and regular rhythm. S1 and S2 normal without gallop, murmur, click, rub or other extra sounds. Abdomen:  Bowel sounds positive,abdomen soft and non-tender without masses, organomegaly or hernias noted. No suprapubic or CVA tenderness Rectal:  no  external abnormalities.   Genitalia:  Pelvic Exam:        External: normal female genitalia without lesions or masses        Vagina: normal without lesions or masses        Cervix: normal without lesions or masses        Adnexa: normal bimanual exam without masses or fullness        Uterus: normal by palpation        Pap smear: performed Msk:  No deformity or scoliosis noted of thoracic or lumbar spine.   Extremities:  No clubbing, cyanosis, edema, or deformity noted with normal full range of motion of all joints.   Neurologic:  alert & oriented X3 and gait normal.   Skin:  Intact without suspicious lesions or rashes Cervical Nodes:  No lymphadenopathy noted Axillary Nodes:  No palpable lymphadenopathy Psych:  Cognition and judgment appear intact. Alert and cooperative with normal attention span and concentration. No apparent delusions, illusions, hallucinations

## 2014-06-17 NOTE — Assessment & Plan Note (Signed)
Reviewed preventive care protocols, scheduled due services, and updated immunizations Discussed nutrition, exercise, diet, and healthy lifestyle.  Pap smear done today.  Bone density scan scheduled.

## 2014-06-17 NOTE — Patient Instructions (Signed)
Good to see you. Happy Holidays!  Please take cipro as directed- 1 tablet twice daily for 5 days.  We will call you with your urine culture results.

## 2014-06-17 NOTE — Assessment & Plan Note (Signed)
Followed by [facility]. On observation with yearly surveillance.

## 2014-06-17 NOTE — Progress Notes (Signed)
Pre visit review using our clinic review tool, if applicable. No additional management support is needed unless otherwise documented below in the visit note. 

## 2014-06-18 ENCOUNTER — Telehealth: Payer: Self-pay | Admitting: Family Medicine

## 2014-06-18 LAB — CYTOLOGY - PAP

## 2014-06-18 NOTE — Telephone Encounter (Signed)
emmi emailed °

## 2014-06-19 ENCOUNTER — Encounter: Payer: Self-pay | Admitting: *Deleted

## 2014-06-20 LAB — URINE CULTURE: Colony Count: >=100000

## 2014-07-09 ENCOUNTER — Telehealth: Payer: Self-pay | Admitting: Family Medicine

## 2014-07-09 NOTE — Telephone Encounter (Signed)
Pt called to get her bone density results She had this done [facility] 06/24/14

## 2014-07-10 NOTE — Telephone Encounter (Signed)
i did not see results in chart--please advise

## 2014-07-10 NOTE — Telephone Encounter (Signed)
Dr. Deborra Medina pt, may be in her inbox for review

## 2014-07-10 NOTE — Telephone Encounter (Signed)
I do not see it in my box.

## 2014-08-30 ENCOUNTER — Encounter: Payer: Self-pay | Admitting: *Deleted

## 2014-08-30 ENCOUNTER — Telehealth: Payer: Self-pay | Admitting: Family Medicine

## 2014-08-30 NOTE — Telephone Encounter (Signed)
Pt needs note for [employer] stating that it is ok to exercise because of her being on high blood pressure. The trainer wants a note from PCP stating it is ok before they will assist her.  Please mail letter to pt home address.  Please call pt on home phone when letter has been mailed.

## 2014-08-30 NOTE — Telephone Encounter (Signed)
Ok to write a letter stating that she is medically cleared to exercise.

## 2014-08-30 NOTE — Telephone Encounter (Signed)
Lm on pts vm informing her letter to be mailed today.

## 2014-12-20 ENCOUNTER — Other Ambulatory Visit: Payer: Self-pay | Admitting: Family Medicine

## 2015-01-29 ENCOUNTER — Telehealth: Payer: Self-pay | Admitting: Family Medicine

## 2015-01-29 NOTE — Telephone Encounter (Signed)
Form placed in Dr Hulen Shouts inbox for review and completion

## 2015-01-29 NOTE — Telephone Encounter (Signed)
Pt dropped off Exercise Science form from [facility] for completion.  She is requesting that if completed by 01/30/15, please mail to address on form, otherwise, please contact patient at [phone].  Placed on [facility] desk

## 2015-01-30 DIAGNOSIS — Z7689 Persons encountering health services in other specified circumstances: Secondary | ICD-10-CM

## 2015-01-30 NOTE — Telephone Encounter (Signed)
Form completed and in my box.

## 2015-04-08 ENCOUNTER — Other Ambulatory Visit: Payer: Self-pay | Admitting: *Deleted

## 2015-04-08 MED ORDER — RALOXIFENE HCL 60 MG PO TABS: 60.0000 mg | ORAL_TABLET | Freq: Every day | ORAL | Status: DC

## 2015-04-08 MED ORDER — AMLODIPINE BESYLATE 5 MG PO TABS: ORAL_TABLET | ORAL | Status: DC

## 2015-04-25 ENCOUNTER — Ambulatory Visit (INDEPENDENT_AMBULATORY_CARE_PROVIDER_SITE_OTHER): Payer: 59 | Admitting: Family Medicine

## 2015-04-25 ENCOUNTER — Encounter: Payer: Self-pay | Admitting: Family Medicine

## 2015-04-25 VITALS — BP 122/80 | HR 62 | Temp 98.2°F | Ht 62.0 in | Wt 135.8 lb

## 2015-04-25 DIAGNOSIS — R300 Dysuria: Secondary | ICD-10-CM | POA: Diagnosis not present

## 2015-04-25 DIAGNOSIS — N390 Urinary tract infection, site not specified: Secondary | ICD-10-CM

## 2015-04-25 LAB — POCT URINALYSIS DIPSTICK
Bilirubin, UA: NEGATIVE
GLUCOSE UA: NEGATIVE
Ketones, UA: NEGATIVE
Leukocytes, UA: NEGATIVE
NITRITE UA: NEGATIVE
PH UA: 6
PROTEIN UA: NEGATIVE
RBC UA: NEGATIVE
UROBILINOGEN UA: 0.2

## 2015-04-25 MED ORDER — CEPHALEXIN 250 MG PO CAPS: 250.0000 mg | ORAL_CAPSULE | Freq: Two times a day (BID) | ORAL | Status: DC

## 2015-04-25 NOTE — Patient Instructions (Signed)
Drink lots of water  Take keflex as directed for suspected uti  We will contact you when your urine culture returns

## 2015-04-25 NOTE — Progress Notes (Signed)
Pre visit review using our clinic review tool, if applicable. No additional management support is needed unless otherwise documented below in the visit note. 

## 2015-04-25 NOTE — Progress Notes (Signed)
Subjective:    Patient ID: Kristen Soto, female    DOB: Jul 01, 1956, 59 y.o.   MRN: [id number]  HPI Here for urinary symptoms Frequency  No burning to urinate  No bladder pain  Just feels like she has to urinate all the time   No blood in urine  No flank pain  No n/v or fever   Has been a bit dizzy - wanted to have her ears checked     Has had a lot of bladder infx in the past  Saw urology Was given abx -to take when she had intercourse  She took a dose - thinks it is a sulfa antibiotic 2 d ago   Keflex usually works the best   [employer] generally tired/malaise/dizzy as well      Results for orders placed or performed in visit on 04/25/15  Urinalysis Dipstick  Result Value Ref Range   Color, UA Light Yellow    Clarity, UA Clear    Glucose, UA Neg.    Bilirubin, UA Neg.    Ketones, UA Neg.    Spec Grav, UA <=1.005    Blood, UA Neg.    pH, UA 6.0    Protein, UA Neg.    Urobilinogen, UA 0.2    Nitrite, UA Neg.    Leukocytes, UA Negative Negative     Patient Active Problem List   Diagnosis Date Noted  . Well woman exam with routine gynecological exam 06/17/2014  . Recurrent UTI 06/17/2014  . IRREGULAR MENSES 06/25/2010  . ALLERGIC RHINITIS 10/11/2008  . Essential hypertension 09/06/2008  . GENITAL HERPES 08/02/2008  . Vitamin D deficiency 08/02/2008  . 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 BREAST CANCER, HX OF 06/19/2008  . Disorder of bone and cartilage 02/27/2001   Past Medical History  Diagnosis Date  . Cancer [facility])     breast  . Hypertension    Past Surgical History  Procedure Laterality Date  . Cystoscopy  02/02/2000    mild urethral trigonitis  . Basal cell carcinoma excision  05/2008    removal, left eyelid   Social History  Substance Use Topics  . Smoking status: Former Research scientist (life sciences)  . Smokeless tobacco: None  . Alcohol Use: 0.0 oz/week    0 Standard drinks or equivalent per week     Comment: wine-occ   Family History  Problem Relation  Age of Onset  . Alcohol abuse Mother   . Hypertension Mother   . Graves' disease Mother   . Heart attack Father 40  . Hypertension Father   . Hypertension Sister   . Heart attack Paternal Grandmother   . Kidney disease Paternal Grandmother   . Cancer Paternal Grandmother 46    breast  . Heart attack Paternal Grandfather   . Cancer Sister 15    breast   Allergies  Allergen Reactions  . Diovan [Valsartan]     HA, nausea  . Hctz [Hydrochlorothiazide]     Dysuria, frequency  . Lisinopril     Possible cough   Current Outpatient Prescriptions on File Prior to Visit  Medication Sig Dispense Refill  . amLODipine (NORVASC) 5 MG tablet TAKE 1 TABLET DAILY 90 tablet 0  . calcium carbonate (TUMS - DOSED IN MG ELEMENTAL CALCIUM) 500 MG chewable tablet Chew 1 tablet by mouth daily.    Marland Kitchen lisinopril (PRINIVIL,ZESTRIL) 40 MG tablet TAKE 1 TABLET DAILY (OFFICEVISTIT REQUIRED FOR        ADDTIONAL REFILLS) 90 tablet 1  .  Multiple Vitamin (MULTIVITAMIN) tablet Take 1 tablet by mouth daily.    . raloxifene (EVISTA) 60 MG tablet Take 1 tablet (60 mg total) by mouth daily. 90 tablet 0   No current facility-administered medications on file prior to visit.    Review of Systems Review of Systems  Constitutional: Negative for fever, appetite change, and unexpected weight change. pos for malaise Eyes: Negative for pain and visual disturbance.  Respiratory: Negative for cough and shortness of breath.   Cardiovascular: Negative for cp or palpitations    Gastrointestinal: Negative for nausea, diarrhea and constipation.  Genitourinary: pos for urgency and frequency. neg for hematuria or flank pain  Skin: Negative for pallor or rash   Neurological: Negative for weakness,  numbness and headaches. pos for light headedness  Hematological: Negative for adenopathy. Does not bruise/bleed easily.  Psychiatric/Behavioral: Negative for dysphoric mood. The patient is not nervous/anxious.         Objective:    Physical Exam  Constitutional: She appears well-developed and well-nourished. No distress.  Well appearing   HENT:  Head: Normocephalic and atraumatic.  Eyes: Conjunctivae and EOM are normal. Pupils are equal, round, and reactive to light.  No nystagmus   Neck: Normal range of motion. Neck supple.  Cardiovascular: Normal rate, regular rhythm and normal heart sounds.   Pulmonary/Chest: Effort normal and breath sounds normal.  Abdominal: Soft. Bowel sounds are normal. She exhibits no distension. There is tenderness. There is no rebound.  No cva tenderness  Mild suprapubic tenderness  Musculoskeletal: She exhibits no edema.  Lymphadenopathy:    She has no cervical adenopathy.  Neurological: She is alert. She has normal reflexes. No cranial nerve deficit. She exhibits normal muscle tone. Coordination normal.  Skin: No rash noted.  Psychiatric: She has a normal mood and affect.          Assessment & Plan:   Problem List Items Addressed This Visit      Genitourinary   Recurrent UTI    ua is neg but pt has taken a dose of ? Septra She has hx of post coital utis Enc fluids cx sent  Keflex ordered (pt states this works best) Update if not starting to improve in several days or if worsening        Relevant Medications   cephALEXin (KEFLEX) 250 MG capsule   Other Relevant Orders   Urine culture (Completed)    Other Visit Diagnoses    Dysuria    -  Primary    Relevant Orders    Urinalysis Dipstick (Completed)    Urine culture (Completed)

## 2015-04-27 NOTE — Assessment & Plan Note (Signed)
ua is neg but pt has taken a dose of ? Septra She has hx of post coital utis Enc fluids cx sent  Keflex ordered (pt states this works best) Update if not starting to improve in several days or if worsening

## 2015-04-28 LAB — URINE CULTURE

## 2015-06-16 ENCOUNTER — Other Ambulatory Visit: Payer: Self-pay | Admitting: Family Medicine

## 2015-07-21 ENCOUNTER — Other Ambulatory Visit: Payer: Self-pay

## 2015-07-23 ENCOUNTER — Encounter: Payer: Self-pay | Admitting: Family Medicine

## 2015-07-24 ENCOUNTER — Other Ambulatory Visit: Payer: Self-pay | Admitting: Family Medicine

## 2015-07-24 ENCOUNTER — Encounter: Payer: Self-pay | Admitting: Family Medicine

## 2015-07-24 DIAGNOSIS — E559 Vitamin D deficiency, unspecified: Secondary | ICD-10-CM

## 2015-07-24 DIAGNOSIS — Z01419 Encounter for gynecological examination (general) (routine) without abnormal findings: Secondary | ICD-10-CM

## 2015-07-25 ENCOUNTER — Other Ambulatory Visit (INDEPENDENT_AMBULATORY_CARE_PROVIDER_SITE_OTHER): Payer: 59

## 2015-07-25 DIAGNOSIS — Z01419 Encounter for gynecological examination (general) (routine) without abnormal findings: Secondary | ICD-10-CM

## 2015-07-25 DIAGNOSIS — E559 Vitamin D deficiency, unspecified: Secondary | ICD-10-CM

## 2015-07-25 LAB — COMPREHENSIVE METABOLIC PANEL
ALBUMIN: 4.4 g/dL (ref 3.5–5.2)
ALK PHOS: 73 U/L (ref 39–117)
ALT: 17 U/L (ref 0–35)
AST: 21 U/L (ref 0–37)
BUN: 19 mg/dL (ref 6–23)
CO2: 27 mEq/L (ref 19–32)
CREATININE: 0.83 mg/dL (ref 0.40–1.20)
Calcium: 9.3 mg/dL (ref 8.4–10.5)
Chloride: 109 mEq/L (ref 96–112)
GFR: 74.85 mL/min (ref >60.00)
GLUCOSE: 84 mg/dL (ref 70–99)
Potassium: 3.8 mEq/L (ref 3.5–5.1)
SODIUM: 143 meq/L (ref 135–145)
TOTAL PROTEIN: 7 g/dL (ref 6.0–8.3)
Total Bilirubin: 0.7 mg/dL (ref 0.2–1.2)

## 2015-07-25 LAB — TSH: TSH: 0.74 u[IU]/mL (ref 0.35–4.50)

## 2015-07-25 LAB — LIPID PANEL
CHOLESTEROL: 196 mg/dL (ref 0–200)
HDL: 71.6 mg/dL (ref >39.00)
LDL CALC: 106 mg/dL — AB (ref 0–99)
NONHDL: 123.92
Total CHOL/HDL Ratio: 3
Triglycerides: 89 mg/dL (ref 0.0–149.0)
VLDL: 17.8 mg/dL (ref 0.0–40.0)

## 2015-07-25 LAB — CBC WITH DIFFERENTIAL/PLATELET
BASOS ABS: 0 10*3/uL (ref 0.0–0.1)
Basophils Relative: 0.4 % (ref 0.0–3.0)
EOS ABS: 0.1 10*3/uL (ref 0.0–0.7)
Eosinophils Relative: 1.2 % (ref 0.0–5.0)
HCT: 41 % (ref 36.0–46.0)
Hemoglobin: 13.9 g/dL (ref 12.0–15.0)
LYMPHS ABS: 1.5 10*3/uL (ref 0.7–4.0)
Lymphocytes Relative: 32.8 % (ref 12.0–46.0)
MCHC: 33.9 g/dL (ref 30.0–36.0)
MCV: 90.6 fl (ref 78.0–100.0)
MONO ABS: 0.3 10*3/uL (ref 0.1–1.0)
MONOS PCT: 5.9 % (ref 3.0–12.0)
NEUTROS ABS: 2.8 10*3/uL (ref 1.4–7.7)
NEUTROS PCT: 59.7 % (ref 43.0–77.0)
PLATELETS: 224 10*3/uL (ref 150.0–400.0)
RBC: 4.53 Mil/uL (ref 3.87–5.11)
RDW: 12.5 % (ref 11.5–15.5)
WBC: 4.7 10*3/uL (ref 4.0–10.5)

## 2015-07-25 LAB — VITAMIN D 25 HYDROXY (VIT D DEFICIENCY, FRACTURES): VITD: 35.84 ng/mL (ref 30.00–100.00)

## 2015-07-29 ENCOUNTER — Encounter: Payer: Self-pay | Admitting: Family Medicine

## 2015-07-29 ENCOUNTER — Ambulatory Visit (INDEPENDENT_AMBULATORY_CARE_PROVIDER_SITE_OTHER): Payer: 59 | Admitting: Family Medicine

## 2015-07-29 VITALS — BP 124/72 | HR 62 | Temp 97.6°F | Ht 61.5 in | Wt 136.5 lb

## 2015-07-29 DIAGNOSIS — Z853 Personal history of malignant neoplasm of breast: Secondary | ICD-10-CM | POA: Diagnosis not present

## 2015-07-29 DIAGNOSIS — I10 Essential (primary) hypertension: Secondary | ICD-10-CM

## 2015-07-29 DIAGNOSIS — M810 Age-related osteoporosis without current pathological fracture: Secondary | ICD-10-CM

## 2015-07-29 DIAGNOSIS — Z01419 Encounter for gynecological examination (general) (routine) without abnormal findings: Secondary | ICD-10-CM

## 2015-07-29 DIAGNOSIS — Z0000 Encounter for general adult medical examination without abnormal findings: Secondary | ICD-10-CM

## 2015-07-29 MED ORDER — AMLODIPINE BESYLATE 5 MG PO TABS: ORAL_TABLET | ORAL | Status: DC

## 2015-07-29 NOTE — Patient Instructions (Signed)
Great to see you.  We will call you about the Prolia.

## 2015-07-29 NOTE — Progress Notes (Signed)
60 yo pleasant female here for CPX and follow up of chronic medical conditions.    H/o recurrent UTIs, saw Dr. Rogers Blocker who recommended she take Macrobid post coital. Saw Dr. Glori Bickers on 10/21 for this complaint- note reviewed. 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  Osteoporosis- has been on evista for a couple of years, DEXA from 2016 shows osteoporosis.  Flu: never Tetanus: 2014 Pap Smear: 06/17/14 Mammogram: 01/17/15 Colonoscopy: 2012 Eye Doctor: yearly Dentist: biannually Bone Density: 02/2015  HTN- has been well controlled on Norvasc 5 mg daily and Lisinopril 40 mg daily.   Lab Results  Component Value Date   CHOL 196 07/25/2015   HDL 71.60 07/25/2015   LDLCALC 106* 07/25/2015   LDLDIRECT 117.3 06/12/2013   TRIG 89.0 07/25/2015   CHOLHDL 3 07/25/2015   Lab Results  Component Value Date   CREATININE 0.83 07/25/2015   Lab Results  Component Value Date   TSH 0.74 07/25/2015   Lab Results  Component Value Date   NA 143 07/25/2015   K 3.8 07/25/2015   CL 109 07/25/2015   CO2 27 07/25/2015   Lab Results  Component Value Date   WBC 4.7 07/25/2015   HGB 13.9 07/25/2015   HCT 41.0 07/25/2015   MCV 90.6 07/25/2015   PLT 224.0 07/25/2015     Current Outpatient Prescriptions on File Prior to Visit  Medication Sig Dispense Refill  . amLODipine (NORVASC) 5 MG tablet TAKE 1 TABLET DAILY 90 tablet 0  . calcium carbonate (TUMS - DOSED IN MG ELEMENTAL CALCIUM) 500 MG chewable tablet Chew 1 tablet by mouth daily.    Marland Kitchen lisinopril (PRINIVIL,ZESTRIL) 40 MG tablet Take 1 tablet (40 mg total) by mouth daily. OFFICE VISIT REQUIRED FOR ANY ADDITIONAL REFILLS 90 tablet 0  . Multiple Vitamin (MULTIVITAMIN) tablet Take 1 tablet by mouth daily.    . raloxifene (EVISTA) 60 MG tablet Take 1 tablet (60 mg total) by mouth daily. 90 tablet 0   No current facility-administered medications on file prior to visit.    Allergies   Allergen Reactions  . Diovan [Valsartan]     HA, nausea  . Hctz [Hydrochlorothiazide]     Dysuria, frequency  . Lisinopril     Possible cough    Past Medical History  Diagnosis Date  . Cancer [facility])     breast  . Hypertension     Past Surgical History  Procedure Laterality Date  . Cystoscopy  02/02/2000    mild urethral trigonitis  . Basal cell carcinoma excision  05/2008    removal, left eyelid    Family History  Problem Relation Age of Onset  . Alcohol abuse Mother   . Hypertension Mother   . Graves' disease Mother   . Heart attack Father 43  . Hypertension Father   . Hypertension Sister   . Heart attack Paternal Grandmother   . Kidney disease Paternal Grandmother   . Cancer Paternal Grandmother 58    breast  . Heart attack Paternal Grandfather   . Cancer Sister 44    breast    Social History   Social History  . Marital Status: Married    Spouse Name: N/A  . Number of Children: 1  . Years of Education: N/A   Occupational History  . PROJECT MANAGEN    Social History Main Topics  . Smoking status: Former Research scientist (life sciences)  . Smokeless tobacco: Not on file  . Alcohol Use:  0.0 oz/week    0 Standard drinks or equivalent per week     Comment: wine-occ  . Drug Use: No  . Sexual Activity: Not on file   Other Topics Concern  . Not on file   Social History Narrative   The PMH, PSH, Social History, Family History, Medications, and allergies have been reviewed in [facility], and have been updated if relevant.    Review of Systems  Constitutional: Negative.   HENT: Negative.   Respiratory: Negative.   Cardiovascular: Negative.   Gastrointestinal: Negative.   Endocrine: Negative.   Genitourinary: Negative.   Musculoskeletal: Negative.   Skin: Negative.   Allergic/Immunologic: Negative.   Neurological: Negative.   Hematological: Negative.   Psychiatric/Behavioral: Negative.   All other systems reviewed and are negative.    PE:  BP 124/72 mmHg  Pulse 62   Temp(Src) 97.6 F (36.4 C) (Oral)  Ht 5' 1.5" (1.562 m)  Wt 136 lb 8 oz (61.916 kg)  BMI 25.38 kg/m2  SpO2 99% Wt Readings from Last 3 Encounters:  07/29/15 136 lb 8 oz (61.916 kg)  04/25/15 135 lb 12 oz (61.576 kg)  06/17/14 136 lb 8 oz (61.916 kg)    General:  Well-developed,well-nourished,in no acute distress; alert,appropriate and cooperative throughout examination Head:  normocephalic and atraumatic.   Eyes:  vision grossly intact, pupils equal, pupils round, and pupils reactive to light.   Ears:  R ear normal and L ear normal.   Nose:  no external deformity.   Mouth:  good dentition.   Neck:  No deformities, masses, or tenderness noted. Lungs:  Normal respiratory effort, chest expands symmetrically. Lungs are clear to auscultation, no crackles or wheezes. Heart:  Normal rate and regular rhythm. S1 and S2 normal without gallop, murmur, click, rub or other extra sounds. Abdomen:  Bowel sounds positive,abdomen soft and non-tender without masses, organomegaly or hernias noted. No suprapubic or CVA tenderness Msk:  No deformity or scoliosis noted of thoracic or lumbar spine.   Extremities:  No clubbing, cyanosis, edema, or deformity noted with normal full range of motion of all joints.   Neurologic:  alert & oriented X3 and gait normal.   Skin:  Intact without suspicious lesions or rashes Cervical Nodes:  No lymphadenopathy noted Axillary Nodes:  No palpable lymphadenopathy Psych:  Cognition and judgment appear intact. Alert and cooperative with normal attention span and concentration. No apparent delusions, illusions, hallucinations

## 2015-07-29 NOTE — Progress Notes (Signed)
Pre visit review using our clinic review tool, if applicable. No additional management support is needed unless otherwise documented below in the visit note. 

## 2015-07-29 NOTE — Assessment & Plan Note (Signed)
Reviewed preventive care protocols, scheduled due services, and updated immunizations Discussed nutrition, exercise, diet, and healthy lifestyle.  Hep C and HIV screening today.

## 2015-07-29 NOTE — Assessment & Plan Note (Signed)
Well controlled on current rxs. No changes made today. 

## 2015-07-29 NOTE — Assessment & Plan Note (Signed)
Deteriorating. Considering prolia- we order this for her.

## 2015-07-30 LAB — HEPATITIS C ANTIBODY: HCV AB: NEGATIVE

## 2015-07-30 LAB — HIV ANTIBODY (ROUTINE TESTING W REFLEX): HIV: NONREACTIVE

## 2015-07-31 NOTE — Addendum Note (Signed)
Addended by: Marchia Bond on: 07/31/2015 03:54 PM   Modules accepted: Miquel Dunn

## 2015-08-01 ENCOUNTER — Encounter: Payer: Self-pay | Admitting: Family Medicine

## 2015-08-11 ENCOUNTER — Telehealth: Payer: Self-pay | Admitting: Family Medicine

## 2015-08-11 NOTE — Telephone Encounter (Signed)
I have electronically submitted pt's info for Prolia insurance verification and will notify you once I have a response. Thank you. °

## 2015-08-14 NOTE — Telephone Encounter (Signed)
Pt left v/m requesting cb with status of prolia request.

## 2015-08-20 NOTE — Telephone Encounter (Signed)
I have rec'd the insurance verification for Ms. Stamp's Prolia.  She has a $5000 deductible, plus 30% co-insurance which means she would be responsible for the entire cost of injection.  However, since pt has Pharmacist, community, [place] has a $25 co-pay card she may be eligible to use to get injection for an estimated cost of $25.  I will give you a pamphlet w/the info and the co-pay card for Ms. Allaire.  She will need to call the # inside the pamphlet to find out if she qualifies and how to proceed w/her injection.  I have sent a copy of the summary of benefits to be scanned into pt's chart.   Once pt recs injection, please let me know actual injection date so I can update the Prolia portal.  If you have any questions, please let me know.

## 2015-08-21 NOTE — Telephone Encounter (Signed)
Lm on pts vm requesting a call back 

## 2015-08-22 NOTE — Telephone Encounter (Signed)
Patient returned [facility] call.  I let patient know Rose's comments and patient will come by to pick up co pay card.

## 2015-08-28 NOTE — Telephone Encounter (Signed)
Spoke w/pt this morning and she wants to schedule Prolia inj.  Per Dr. Deborra Medina, her last calcium is ok so pt can proceed w/inj.  Scheduled for 09/03/2015.  Thank you!   Cc:  Dee for ordering purposes

## 2015-09-01 ENCOUNTER — Telehealth: Payer: Self-pay | Admitting: Family Medicine

## 2015-09-01 NOTE — Telephone Encounter (Signed)
Opened errored

## 2015-09-03 ENCOUNTER — Ambulatory Visit (INDEPENDENT_AMBULATORY_CARE_PROVIDER_SITE_OTHER): Payer: 59 | Admitting: *Deleted

## 2015-09-03 ENCOUNTER — Telehealth: Payer: Self-pay

## 2015-09-03 DIAGNOSIS — Z111 Encounter for screening for respiratory tuberculosis: Secondary | ICD-10-CM

## 2015-09-03 DIAGNOSIS — M810 Age-related osteoporosis without current pathological fracture: Secondary | ICD-10-CM

## 2015-09-03 MED ORDER — DENOSUMAB 60 MG/ML ~~LOC~~ SOLN
60.0000 mg | Freq: Once | SUBCUTANEOUS | Status: AC
  Administered 2015-09-03: 60 mg via SUBCUTANEOUS

## 2015-09-03 NOTE — Telephone Encounter (Signed)
Kristen Soto came into to get an injection, she was also going to get her TB test. 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 Kristen Soto also stated she needs to pick these up by Friday if at all possible.

## 2015-09-05 ENCOUNTER — Encounter: Payer: Self-pay | Admitting: *Deleted

## 2015-09-05 LAB — TB SKIN TEST
INDURATION: 0 mm
TB SKIN TEST: NEGATIVE

## 2015-09-29 ENCOUNTER — Other Ambulatory Visit: Payer: Self-pay | Admitting: Family Medicine

## 2015-12-15 ENCOUNTER — Ambulatory Visit (INDEPENDENT_AMBULATORY_CARE_PROVIDER_SITE_OTHER): Payer: 59 | Admitting: Family Medicine

## 2015-12-15 ENCOUNTER — Encounter: Payer: Self-pay | Admitting: Family Medicine

## 2015-12-15 VITALS — BP 130/92 | HR 80 | Temp 98.1°F | Wt 137.8 lb

## 2015-12-15 DIAGNOSIS — N390 Urinary tract infection, site not specified: Secondary | ICD-10-CM

## 2015-12-15 DIAGNOSIS — R339 Retention of urine, unspecified: Secondary | ICD-10-CM | POA: Diagnosis not present

## 2015-12-15 LAB — POC URINALSYSI DIPSTICK (AUTOMATED)
Bilirubin, UA: NEGATIVE
Glucose, UA: NEGATIVE
KETONES UA: NEGATIVE
LEUKOCYTES UA: NEGATIVE
Nitrite, UA: NEGATIVE
PROTEIN UA: NEGATIVE
Spec Grav, UA: >=1.03
Urobilinogen, UA: 0.2
pH, UA: 6

## 2015-12-15 MED ORDER — CEPHALEXIN 500 MG PO CAPS: 500.0000 mg | ORAL_CAPSULE | Freq: Two times a day (BID) | ORAL | Status: DC

## 2015-12-15 NOTE — Addendum Note (Signed)
Addended by: Ria Bush on: 12/15/2015 04:33 PM   Modules accepted: Miquel Dunn

## 2015-12-15 NOTE — Progress Notes (Signed)
Pre visit review using our clinic review tool, if applicable. No additional management support is needed unless otherwise documented below in the visit note. 

## 2015-12-15 NOTE — Patient Instructions (Signed)
Urine showing some blood - we have sent culture. Push fluids and rest. Treat possible UTI with keflex twice daily for 1 week.  Update if not improving with treatment. Have a good vacation!

## 2015-12-15 NOTE — Progress Notes (Signed)
BP 130/92 mmHg  Pulse 80  Temp(Src) 98.1 F (36.7 C) (Oral)  Wt 137 lb 12 oz (62.483 kg)   CC: UTI?  Subjective:    Patient ID: Kristen Soto, female    DOB: Apr 02, 1956, 60 y.o.   MRN: [id number]  HPI: Kristen Soto is a 60 y.o. female presenting on 12/15/2015 for Urinary Tract Infection   1 wk h/o UTI sxs including urgency, incomplete emptying, R flank discomfort in am, frequency. + nausea. Similar to prior UTIs (h/o recurrent UTIs). Some bowel changes without constipation or diarrhea. Increased stress recently - father's illness.   No dysuria, hematuria, fevers/chills, lower abd discomfort.  Last UTI 04/2015 - Enterococcus. Before this 06/2014 - E coli.   Previously saw urologist and on preventative abx peri-intercourse.  CYSTOSCOPY Date: 02/02/2000 mild urethral trigonitis.   Planning trip to [place] on Thursday for 10 day vacation.   Relevant past medical, surgical, family and social history reviewed and updated as indicated. Interim medical history since our last visit reviewed. Allergies and medications reviewed and updated. Current Outpatient Prescriptions on File Prior to Visit  Medication Sig  . amLODipine (NORVASC) 5 MG tablet TAKE 1 TABLET DAILY  . calcium carbonate (TUMS - DOSED IN MG ELEMENTAL CALCIUM) 500 MG chewable tablet Chew 1 tablet by mouth daily.  Marland Kitchen lisinopril (PRINIVIL,ZESTRIL) 40 MG tablet TAKE 1 TABLET DAILY (OFFICEVISIT REQUIRED FOR ANY     ADDITIONAL REFILLS)  . Multiple Vitamin (MULTIVITAMIN) tablet Take 1 tablet by mouth daily.   No current facility-administered medications on file prior to visit.    Review of Systems Per HPI unless specifically indicated in ROS section     Objective:    BP 130/92 mmHg  Pulse 80  Temp(Src) 98.1 F (36.7 C) (Oral)  Wt 137 lb 12 oz (62.483 kg)  Wt Readings from Last 3 Encounters:  12/15/15 137 lb 12 oz (62.483 kg)  07/29/15 136 lb 8 oz (61.916 kg)  04/25/15 135 lb 12 oz (61.576 kg)    Physical Exam    Constitutional: She appears well-developed and well-nourished. No distress.  Abdominal: Soft. Normal appearance and bowel sounds are normal. She exhibits no distension and no mass. There is no hepatosplenomegaly. There is no tenderness. There is CVA tenderness (mild R). There is no rigidity, no rebound, no guarding and negative Murphy's sign.  Nursing note and vitals reviewed.  Results for orders placed or performed in visit on 12/15/15  POCT Urinalysis Dipstick (Automated)  Result Value Ref Range   Color, UA Yellow    Clarity, UA Clear    Glucose, UA Negative    Bilirubin, UA Negative    Ketones, UA Negative    Spec Grav, UA >=1.030    Blood, UA Trace    pH, UA 6.0    Protein, UA Negative    Urobilinogen, UA 0.2    Nitrite, UA Negative    Leukocytes, UA Negative Negative      Assessment & Plan:   Problem List Items Addressed This Visit    Recurrent UTI - Primary    H/o recurrent UTIs, but none recently (last 04/2015). States keflex works well for her.  UA today and story suspicious for rpt UTI.  Check UCx given history and upcoming travel.  Treat with keflex 500mg  bid x 7 days. Push fluids, rest. Update if persistent sxs despite treatment.       Relevant Medications   cephALEXin (KEFLEX) 500 MG capsule   Other Relevant Orders  Urine culture    Other Visit Diagnoses    Incomplete bladder emptying        Relevant Orders    POCT Urinalysis Dipstick (Automated) (Completed)        Follow up plan: Return if symptoms worsen or fail to improve.  Ria Bush, MD

## 2015-12-15 NOTE — Assessment & Plan Note (Signed)
H/o recurrent UTIs, but none recently (last 04/2015). States keflex works well for her.  UA today and story suspicious for rpt UTI.  Check UCx given history and upcoming travel.  Treat with keflex 500mg  bid x 7 days. Push fluids, rest. Update if persistent sxs despite treatment.

## 2015-12-16 LAB — URINE CULTURE
Colony Count: NO GROWTH
Organism ID, Bacteria: NO GROWTH

## 2016-01-09 ENCOUNTER — Other Ambulatory Visit: Payer: Self-pay | Admitting: Family Medicine

## 2016-01-23 ENCOUNTER — Telehealth: Payer: Self-pay | Admitting: Family Medicine

## 2016-01-23 DIAGNOSIS — Z1239 Encounter for other screening for malignant neoplasm of breast: Secondary | ICD-10-CM

## 2016-01-23 NOTE — Telephone Encounter (Signed)
No, just screening.   Thanks

## 2016-01-23 NOTE — Telephone Encounter (Signed)
Does it need to be diagnostic?

## 2016-01-23 NOTE — Telephone Encounter (Signed)
Pt called to schedule her prolia shot in sept  Is it ok to schedule

## 2016-01-23 NOTE — Telephone Encounter (Signed)
Pt went this week to [facility] to get her 3d mammogram and wanted to make her appointment for next year 2018 They need order from pcp

## 2016-01-23 NOTE — Telephone Encounter (Signed)
Ok thanks.  Order entered.

## 2016-02-02 ENCOUNTER — Telehealth: Payer: Self-pay | Admitting: Family Medicine

## 2016-02-02 ENCOUNTER — Encounter: Payer: Self-pay | Admitting: Family Medicine

## 2016-02-02 DIAGNOSIS — Z1239 Encounter for other screening for malignant neoplasm of breast: Secondary | ICD-10-CM

## 2016-02-02 DIAGNOSIS — Z853 Personal history of malignant neoplasm of breast: Secondary | ICD-10-CM

## 2016-02-02 NOTE — Telephone Encounter (Signed)
Pt needs diag mammogram and ultra sound left and right she goes to [facility]

## 2016-02-02 NOTE — Telephone Encounter (Signed)
Can you please process her insurance benefits for this pts prolia injection?

## 2016-02-02 NOTE — Telephone Encounter (Signed)
Pt sent my chart message needing to get a diag mammogram 3d

## 2016-02-03 NOTE — Telephone Encounter (Signed)
Orders entered

## 2016-02-04 NOTE — Telephone Encounter (Signed)
I have electronically submitted pt's info for Prolia insurance verification and will notify you once I have a response. Thank you. °

## 2016-02-23 NOTE — Telephone Encounter (Signed)
I have rec'd Ms. Kristen Soto's insurance verification for Prolia and she will have an estimated responsibility of $290.00.  Please make pt aware this is an estimate and we will not know an exact amt until insurance(s) has/have paid. I have sent a copy of the summary of benefits to be scanned into pt's chart.  If pt cannot afford $290 for her injection, please advise her to contact [employer] at [phone] and select option #1 to see if she qualifies for one of their assistance programs.  If she qualifies they will instruct her how to proceed.   Once pt recs injection, please let me know actual injection date so I can update the Prolia portal.  If you have any questions, please let me know.  Thank you!  Cc: Beatriz Stallion (for ordering purposes)

## 2016-02-23 NOTE — Telephone Encounter (Signed)
Lm on pts vm requesting a call back 

## 2016-02-24 ENCOUNTER — Encounter: Payer: Self-pay | Admitting: Family Medicine

## 2016-02-24 ENCOUNTER — Ambulatory Visit (INDEPENDENT_AMBULATORY_CARE_PROVIDER_SITE_OTHER): Payer: 59 | Admitting: Family Medicine

## 2016-02-24 VITALS — BP 122/80 | HR 69 | Temp 98.6°F | Wt 137.0 lb

## 2016-02-24 DIAGNOSIS — M810 Age-related osteoporosis without current pathological fracture: Secondary | ICD-10-CM

## 2016-02-24 DIAGNOSIS — M7061 Trochanteric bursitis, right hip: Secondary | ICD-10-CM

## 2016-02-24 DIAGNOSIS — M25473 Effusion, unspecified ankle: Secondary | ICD-10-CM

## 2016-02-24 DIAGNOSIS — R609 Edema, unspecified: Secondary | ICD-10-CM

## 2016-02-24 DIAGNOSIS — M7989 Other specified soft tissue disorders: Secondary | ICD-10-CM | POA: Diagnosis not present

## 2016-02-24 NOTE — Patient Instructions (Signed)
Go to the lab on the way out.  I'll route your papers to Dr. Deborra Medina.  She'll likely be in touch about the prolia injection in the future.  You may need to stop the amlodipine as that could contribute to the swelling in your ankle.  I'll defer to Dr. Deborra Medina about that.  Likely bursitis in the hip. Try icing a few times a day.  That should help.  Take care.  Glad to see you.

## 2016-02-24 NOTE — Progress Notes (Signed)
Pre visit review using our clinic review tool, if applicable. No additional management support is needed unless otherwise documented below in the visit note. 
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
## 2016-02-24 NOTE — Telephone Encounter (Signed)
Spoke to pt and advised per Rose. Pt is agreeable but will still contact prolia to verify if she qualifies for assistance. Pt is scheduled to see Dr Damita Dunnings this afternoon and will have Ca labs drawn at that time.

## 2016-02-25 DIAGNOSIS — M7060 Trochanteric bursitis, unspecified hip: Secondary | ICD-10-CM | POA: Insufficient documentation

## 2016-02-25 DIAGNOSIS — M25473 Effusion, unspecified ankle: Secondary | ICD-10-CM | POA: Insufficient documentation

## 2016-02-25 LAB — VITAMIN D 25 HYDROXY (VIT D DEFICIENCY, FRACTURES): VITD: 37.27 ng/mL (ref 30.00–100.00)

## 2016-02-25 LAB — CALCIUM: Calcium: 9.3 mg/dL (ref 8.4–10.5)

## 2016-02-25 LAB — D-DIMER, QUANTITATIVE (NOT AT ARMC): D DIMER QUANT: 0.46 ug{FEU}/mL (ref <0.50)

## 2016-02-25 NOTE — Assessment & Plan Note (Signed)
Discussed with patient about anatomy. Benign exam except for tenderness over the greater trochanteric bursa. No significant findings on exam of right hip per se, i.e. no sign of significant hip arthritis based on exam. At this point, needs to ice the right hip, 5 minutes on 5 minutes off. Routine cautions given to patient. Activity as tolerated. She understood.

## 2016-02-25 NOTE — Telephone Encounter (Signed)
I have reserved a Prolia for her.

## 2016-02-25 NOTE — Assessment & Plan Note (Signed)
I put in the order for the calcium and vitamin D level for the PCP manage. I will defer to PCP about these labs and for the paperwork patient had. Paperwork was placed on PCPs desk per routine.

## 2016-02-25 NOTE — Assessment & Plan Note (Signed)
Would check d-dimer. Unlikely to be a clot. Discussed with patient. Unclear if this is related to amlodipine use. I will defer to PCP. Okay for outpatient follow-up. See notes on labs. >25 minutes spent in face to face time with patient, >50% spent in counselling or coordination of care

## 2016-02-29 ENCOUNTER — Encounter: Payer: Self-pay | Admitting: Family Medicine

## 2016-03-09 ENCOUNTER — Encounter: Payer: Self-pay | Admitting: Family Medicine

## 2016-03-16 ENCOUNTER — Ambulatory Visit (INDEPENDENT_AMBULATORY_CARE_PROVIDER_SITE_OTHER): Payer: 59 | Admitting: Internal Medicine

## 2016-03-16 DIAGNOSIS — M810 Age-related osteoporosis without current pathological fracture: Secondary | ICD-10-CM

## 2016-03-16 MED ORDER — DENOSUMAB 60 MG/ML ~~LOC~~ SOLN
60.0000 mg | Freq: Once | SUBCUTANEOUS | Status: AC
  Administered 2016-03-16: 60 mg via SUBCUTANEOUS

## 2016-03-17 NOTE — Progress Notes (Signed)
Biannual Prolia injections ordered for pt by Dr. Deborra Medina on 07/29/15.

## 2016-03-17 NOTE — Progress Notes (Signed)
Prolia injection given at nurse visit, chart reviewed Webb Silversmith, NP

## 2016-07-30 ENCOUNTER — Other Ambulatory Visit: Payer: Self-pay | Admitting: Family Medicine

## 2016-07-30 DIAGNOSIS — Z01419 Encounter for gynecological examination (general) (routine) without abnormal findings: Secondary | ICD-10-CM

## 2016-08-03 ENCOUNTER — Other Ambulatory Visit: Payer: Self-pay | Admitting: Family Medicine

## 2016-08-10 ENCOUNTER — Other Ambulatory Visit: Payer: Self-pay | Admitting: *Deleted

## 2016-08-10 ENCOUNTER — Encounter: Payer: 59 | Admitting: Family Medicine

## 2016-08-10 MED ORDER — AMLODIPINE BESYLATE 5 MG PO TABS: ORAL_TABLET | ORAL | 0 refills | Status: DC

## 2016-08-13 ENCOUNTER — Other Ambulatory Visit (INDEPENDENT_AMBULATORY_CARE_PROVIDER_SITE_OTHER): Payer: BLUE CROSS/BLUE SHIELD

## 2016-08-13 DIAGNOSIS — Z01419 Encounter for gynecological examination (general) (routine) without abnormal findings: Secondary | ICD-10-CM

## 2016-08-13 LAB — COMPREHENSIVE METABOLIC PANEL
ALBUMIN: 4.5 g/dL (ref 3.5–5.2)
ALT: 28 U/L (ref 0–35)
AST: 28 U/L (ref 0–37)
Alkaline Phosphatase: 52 U/L (ref 39–117)
BUN: 14 mg/dL (ref 6–23)
CHLORIDE: 105 meq/L (ref 96–112)
CO2: 29 meq/L (ref 19–32)
CREATININE: 0.84 mg/dL (ref 0.40–1.20)
Calcium: 9.3 mg/dL (ref 8.4–10.5)
GFR: 73.55 mL/min (ref >60.00)
GLUCOSE: 97 mg/dL (ref 70–99)
POTASSIUM: 3.5 meq/L (ref 3.5–5.1)
SODIUM: 139 meq/L (ref 135–145)
Total Bilirubin: 0.8 mg/dL (ref 0.2–1.2)
Total Protein: 7.2 g/dL (ref 6.0–8.3)

## 2016-08-13 LAB — LIPID PANEL
CHOL/HDL RATIO: 3
Cholesterol: 189 mg/dL (ref 0–200)
HDL: 65.2 mg/dL (ref >39.00)
LDL CALC: 105 mg/dL — AB (ref 0–99)
NONHDL: 123.52
Triglycerides: 94 mg/dL (ref 0.0–149.0)
VLDL: 18.8 mg/dL (ref 0.0–40.0)

## 2016-08-13 LAB — CBC WITH DIFFERENTIAL/PLATELET
BASOS PCT: 0.8 % (ref 0.0–3.0)
Basophils Absolute: 0 10*3/uL (ref 0.0–0.1)
EOS PCT: 1.2 % (ref 0.0–5.0)
Eosinophils Absolute: 0.1 10*3/uL (ref 0.0–0.7)
HCT: 43 % (ref 36.0–46.0)
Hemoglobin: 14.9 g/dL (ref 12.0–15.0)
LYMPHS ABS: 1 10*3/uL (ref 0.7–4.0)
Lymphocytes Relative: 22.1 % (ref 12.0–46.0)
MCHC: 34.6 g/dL (ref 30.0–36.0)
MCV: 89.4 fl (ref 78.0–100.0)
MONO ABS: 0.5 10*3/uL (ref 0.1–1.0)
Monocytes Relative: 10.8 % (ref 3.0–12.0)
NEUTROS ABS: 2.9 10*3/uL (ref 1.4–7.7)
NEUTROS PCT: 65.1 % (ref 43.0–77.0)
PLATELETS: 196 10*3/uL (ref 150.0–400.0)
RBC: 4.81 Mil/uL (ref 3.87–5.11)
RDW: 12.3 % (ref 11.5–15.5)
WBC: 4.5 10*3/uL (ref 4.0–10.5)

## 2016-08-13 LAB — TSH: TSH: 1.05 u[IU]/mL (ref 0.35–4.50)

## 2016-08-17 ENCOUNTER — Ambulatory Visit (INDEPENDENT_AMBULATORY_CARE_PROVIDER_SITE_OTHER): Payer: BLUE CROSS/BLUE SHIELD | Admitting: Family Medicine

## 2016-08-17 ENCOUNTER — Encounter: Payer: Self-pay | Admitting: Family Medicine

## 2016-08-17 ENCOUNTER — Other Ambulatory Visit (HOSPITAL_COMMUNITY)
Admission: RE | Admit: 2016-08-17 | Discharge: 2016-08-17 | Disposition: A | Discharge status: Home / Self Care | Payer: BLUE CROSS/BLUE SHIELD | Source: Ambulatory Visit | Attending: Family Medicine | Admitting: Family Medicine

## 2016-08-17 VITALS — BP 140/74 | HR 68 | Temp 98.0°F | Ht 61.5 in | Wt 139.2 lb

## 2016-08-17 DIAGNOSIS — Z853 Personal history of malignant neoplasm of breast: Secondary | ICD-10-CM | POA: Diagnosis not present

## 2016-08-17 DIAGNOSIS — Z01419 Encounter for gynecological examination (general) (routine) without abnormal findings: Secondary | ICD-10-CM | POA: Insufficient documentation

## 2016-08-17 DIAGNOSIS — K5901 Slow transit constipation: Secondary | ICD-10-CM

## 2016-08-17 DIAGNOSIS — Z1151 Encounter for screening for human papillomavirus (HPV): Secondary | ICD-10-CM | POA: Diagnosis not present

## 2016-08-17 DIAGNOSIS — N649 Disorder of breast, unspecified: Secondary | ICD-10-CM | POA: Diagnosis not present

## 2016-08-17 DIAGNOSIS — I10 Essential (primary) hypertension: Secondary | ICD-10-CM

## 2016-08-17 DIAGNOSIS — N6459 Other signs and symptoms in breast: Secondary | ICD-10-CM

## 2016-08-17 DIAGNOSIS — K5900 Constipation, unspecified: Secondary | ICD-10-CM | POA: Insufficient documentation

## 2016-08-17 NOTE — Assessment & Plan Note (Signed)
Reasonable control. No changes made. 

## 2016-08-17 NOTE — Assessment & Plan Note (Signed)
Likely normal variant still recovering from recent GI illness. Advised adding a little fiber, water. Call or return to clinic prn if these symptoms worsen or fail to improve as anticipated. The patient indicates understanding of these issues and agrees with the plan.

## 2016-08-17 NOTE — Patient Instructions (Addendum)
Great to see you.  Please check with your insurance company about Reclast coverage.  Check with your insurance to see if they will cover the shingles shot.  Add metamucil as we discussed and keep me updated.

## 2016-08-17 NOTE — Progress Notes (Signed)
Pre visit review using our clinic review tool, if applicable. No additional management support is needed unless otherwise documented below in the visit note. 

## 2016-08-17 NOTE — Addendum Note (Signed)
Addended by: Modena Nunnery on: 08/17/2016 09:55 AM   Modules accepted: Orders

## 2016-08-17 NOTE — Assessment & Plan Note (Signed)
Reviewed preventive care protocols, scheduled due services, and updated immunizations Discussed nutrition, exercise, diet, and healthy lifestyle.  Pap smear done today. 
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
## 2016-08-17 NOTE — Assessment & Plan Note (Signed)
With h/o breast CA. Will order diag mammo.

## 2016-08-19 LAB — CYTOLOGY - PAP
Diagnosis: NEGATIVE
HPV: NOT DETECTED

## 2016-08-30 ENCOUNTER — Ambulatory Visit (INDEPENDENT_AMBULATORY_CARE_PROVIDER_SITE_OTHER): Payer: BLUE CROSS/BLUE SHIELD | Admitting: Family Medicine

## 2016-08-30 ENCOUNTER — Encounter: Payer: Self-pay | Admitting: Family Medicine

## 2016-08-30 ENCOUNTER — Telehealth: Payer: Self-pay | Admitting: *Deleted

## 2016-08-30 VITALS — BP 134/80 | HR 81 | Temp 100.8°F | Ht 61.5 in | Wt 137.0 lb

## 2016-08-30 DIAGNOSIS — J069 Acute upper respiratory infection, unspecified: Secondary | ICD-10-CM | POA: Diagnosis not present

## 2016-08-30 DIAGNOSIS — B9789 Other viral agents as the cause of diseases classified elsewhere: Secondary | ICD-10-CM

## 2016-08-30 DIAGNOSIS — R509 Fever, unspecified: Secondary | ICD-10-CM | POA: Diagnosis not present

## 2016-08-30 LAB — POC INFLUENZA A&B (BINAX/QUICKVUE)
INFLUENZA A, POC: NEGATIVE
Influenza B, POC: NEGATIVE

## 2016-08-30 NOTE — Progress Notes (Signed)
Subjective:    Patient ID: Kristen Soto, female    DOB: Feb 04, 1956, 61 y.o.   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  Eyes: Negative for pain and visual disturbance.  ENt pos for mild nasal cong and pnd/ neg for sinus pain  Respiratory: Negative for wheeze and shortness of breath.  pos for cough that is not productive  Cardiovascular: Negative for cp or palpitations    Gastrointestinal: Negative for nausea, diarrhea and constipation.  Genitourinary: Negative for urgency and frequency.  Skin: Negative for pallor or rash   MSK pos for R lateral leg pain for quite a while -? Bursitis  Neurological: Negative for weakness, light-headedness, numbness and headaches.  Hematological: Negative for adenopathy. Does not bruise/bleed easily.  Psychiatric/Behavioral: Negative for dysphoric mood. The patient is not nervous/anxious.      Objective:   Physical Exam  Constitutional: She appears well-developed and well-nourished. No distress.  HENT:  Head: Normocephalic and atraumatic.  Right Ear: External ear normal.  Left Ear: External ear normal.  Mouth/Throat: Oropharynx is clear and  moist.  Nares are injected and congested  No sinus tenderness Clear rhinorrhea and post nasal drip   Eyes: Conjunctivae and EOM are normal. Pupils are equal, round, and reactive to light. Right eye exhibits no discharge. Left eye exhibits no discharge.  Neck: Normal range of motion. Neck supple.  Cardiovascular: Normal rate and normal heart sounds.   Pulmonary/Chest: Effort normal and breath sounds normal. No respiratory distress. She has no wheezes. She has no rales. She exhibits no tenderness.  Good air exch  No wheezing   Lymphadenopathy:    She has no cervical adenopathy.  Neurological: She is alert.  Skin: Skin is warm and dry. No rash noted.  Psychiatric: She has a normal mood and affect.          Assessment & Plan:   Problem List Items Addressed This Visit      Respiratory   Viral upper respiratory illness    With neg flu screen  Disc symptomatic care - see instructions on AVS  She prefers asa for temp  Update if not starting to improve in a week or if worsening  - esp if no imp in temperature elevation or if new symptoms        Other Visit Diagnoses    Fever, unspecified fever cause    -  Primary   Relevant Orders   POC Influenza A&B(BINAX/QUICKVUE) (Completed)

## 2016-08-30 NOTE — Patient Instructions (Addendum)
Your flu test was negative today  I think you have a viral upper respiratory infection  Treat the fever for comfort  Drink lots of fluids  Get extra rest  Tussin DM is fine for cough   Take care of yourself   Update if not starting to improve in a week or if worsening         Upper Respiratory Infection, Adult Most upper respiratory infections (URIs) are a viral infection of the air passages leading to the lungs. A URI affects the nose, throat, and upper air passages. The most common type of URI is nasopharyngitis and is typically referred to as "the common cold." URIs run their course and usually go away on their own. Most of the time, a URI does not require medical attention, but sometimes a bacterial infection in the upper airways can follow a viral infection. This is called a secondary infection. Sinus and middle ear infections are common types of secondary upper respiratory infections. Bacterial pneumonia can also complicate a URI. A URI can worsen asthma and chronic obstructive pulmonary disease (COPD). Sometimes, these complications can require emergency medical care and may be life threatening. What are the causes? Almost all URIs are caused by viruses. A virus is a type of germ and can spread from one person to another. What increases the risk? You may be at risk for a URI if:  You smoke.  You have chronic heart or lung disease.  You have a weakened defense (immune) system.  You are very young or very old.  You have nasal allergies or asthma.  You work in crowded or poorly ventilated areas.  You work in health care facilities or schools. What are the signs or symptoms? Symptoms typically develop 2-3 days after you come in contact with a cold virus. Most viral URIs last 7-10 days. However, viral URIs from the influenza virus (flu virus) can last 14-18 days and are typically more severe. Symptoms may include:  Runny or stuffy (congested)  nose.  Sneezing.  Cough.  Sore throat.  Headache.  Fatigue.  Fever.  Loss of appetite.  Pain in your forehead, behind your eyes, and over your cheekbones (sinus pain).  Muscle aches. How is this diagnosed? Your health care provider may diagnose a URI by:  Physical exam.  Tests to check that your symptoms are not due to another condition such as:  Strep throat.  Sinusitis.  Pneumonia.  Asthma. How is this treated? A URI goes away on its own with time. It cannot be cured with medicines, but medicines may be prescribed or recommended to relieve symptoms. Medicines may help:  Reduce your fever.  Reduce your cough.  Relieve nasal congestion. Follow these instructions at home:  Take medicines only as directed by your health care provider.  Gargle warm saltwater or take cough drops to comfort your throat as directed by your health care provider.  Use a warm mist humidifier or inhale steam from a shower to increase air moisture. This may make it easier to breathe.  Drink enough fluid to keep your urine clear or pale yellow.  Eat soups and other clear broths and maintain good nutrition.  Rest as needed.  Return to work when your temperature has returned to normal or as your health care provider advises. You may need to stay home longer to avoid infecting others. You can also use a face mask and careful hand washing to prevent spread of the virus.  Increase the usage of your  inhaler if you have asthma.  Do not use any tobacco products, including cigarettes, chewing tobacco, or electronic cigarettes. If you need help quitting, ask your health care provider. How is this prevented? The best way to protect yourself from getting a cold is to practice good hygiene.  Avoid oral or hand contact with people with cold symptoms.  Wash your hands often if contact occurs. There is no clear evidence that vitamin C, vitamin E, echinacea, or exercise reduces the chance of  developing a cold. However, it is always recommended to get plenty of rest, exercise, and practice good nutrition. Contact a health care provider if:  You are getting worse rather than better.  Your symptoms are not controlled by medicine.  You have chills.  You have worsening shortness of breath.  You have brown or red mucus.  You have yellow or brown nasal discharge.  You have pain in your face, especially when you bend forward.  You have a fever.  You have swollen neck glands.  You have pain while swallowing.  You have white areas in the back of your throat. Get help right away if:  You have severe or persistent:  Headache.  Ear pain.  Sinus pain.  Chest pain.  You have chronic lung disease and any of the following:  Wheezing.  Prolonged cough.  Coughing up blood.  A change in your usual mucus.  You have a stiff neck.  You have changes in your:  Vision.  Hearing.  Thinking.  Mood. This information is not intended to replace advice given to you by your health care provider. Make sure you discuss any questions you have with your health care provider. Document Released: 12/15/2000 Document Revised: 02/22/2016 Document Reviewed: 09/26/2013 Elsevier Interactive Patient Education  2017 [employer].

## 2016-08-30 NOTE — Assessment & Plan Note (Signed)
With neg flu screen  Disc symptomatic care - see instructions on AVS  She prefers asa for temp  Update if not starting to improve in a week or if worsening  - esp if no imp in temperature elevation or if new symptoms

## 2016-08-30 NOTE — Progress Notes (Signed)
Pre visit review using our clinic review tool, if applicable. No additional management support is needed unless otherwise documented below in the visit note. 

## 2016-08-30 NOTE — Telephone Encounter (Signed)
PT dropped off form to be filled out. Please call her when it is complete. Form placed in [facility].

## 2016-08-31 NOTE — Telephone Encounter (Signed)
Form signed and in my box. 

## 2016-08-31 NOTE — Telephone Encounter (Signed)
Patient notified form is ready for pick up. °

## 2016-08-31 NOTE — Telephone Encounter (Signed)
Pt form given to Dr Deborra Medina

## 2016-09-03 ENCOUNTER — Telehealth: Payer: Self-pay | Admitting: Family Medicine

## 2016-09-03 DIAGNOSIS — Z853 Personal history of malignant neoplasm of breast: Secondary | ICD-10-CM

## 2016-09-03 DIAGNOSIS — N6459 Other signs and symptoms in breast: Secondary | ICD-10-CM

## 2016-09-03 NOTE — Telephone Encounter (Signed)
[facility] is requesting change in order - placed new order per marion.

## 2016-09-03 NOTE — Telephone Encounter (Signed)
Thank you :)

## 2016-09-06 ENCOUNTER — Encounter: Payer: Self-pay | Admitting: Family Medicine

## 2016-09-09 ENCOUNTER — Telehealth: Payer: Self-pay | Admitting: *Deleted

## 2016-09-09 NOTE — Telephone Encounter (Signed)
Spoke to pt who states she has not heard a response regarding her reclast and would like to proceed with prolia. Information has been submitted to pts insurance for verification of benefits. Awaiting response for coverage

## 2016-09-09 NOTE — Telephone Encounter (Signed)
Lm on pts vm requesting a call back to confirm if she is wanting to continue prolia. Pt received last injection 03/2016 but in  08/2016 requested ppwk be completed for reclast. Awaiting a call back from pt.

## 2016-10-14 ENCOUNTER — Other Ambulatory Visit: Payer: Self-pay | Admitting: Family Medicine

## 2016-11-17 NOTE — Telephone Encounter (Signed)
Unfortunately, prolia would not be  beneficial to pt as she has a $6000 out of pocket cost. Even with the assistance program, her out of pocket expense would be expensive. Pt is wanting to know if reclast and fosamax would have the same benefits as prolia, and is wanting to know how to proceed with infusion. Pt will contact her insurance company to discuss costs of each Tx

## 2016-11-18 NOTE — Telephone Encounter (Signed)
Fosamax would be very easy- just a oral medication that she would take weekly.  Reclast may be costly for her but only requires a once yearly infusion.  Please let me know what she decides.

## 2016-11-18 NOTE — Telephone Encounter (Signed)
Left detailed message.   

## 2016-11-22 ENCOUNTER — Telehealth: Payer: Self-pay

## 2016-11-22 NOTE — Telephone Encounter (Signed)
Pt has never had shingles vaccine and wants to get the new shingles vaccine; pt will cb in few weeks; [facility] not [facility].

## 2016-11-30 ENCOUNTER — Encounter: Payer: Self-pay | Admitting: Family Medicine

## 2016-12-22 ENCOUNTER — Encounter: Payer: Self-pay | Admitting: Family Medicine

## 2017-02-01 ENCOUNTER — Other Ambulatory Visit: Payer: Self-pay

## 2017-02-01 ENCOUNTER — Telehealth: Payer: Self-pay | Admitting: Family Medicine

## 2017-02-01 DIAGNOSIS — M810 Age-related osteoporosis without current pathological fracture: Secondary | ICD-10-CM

## 2017-02-01 MED ORDER — AMLODIPINE BESYLATE 5 MG PO TABS: ORAL_TABLET | ORAL | 2 refills | Status: DC

## 2017-02-01 NOTE — Telephone Encounter (Signed)
Please place New Bone Density order into Epic. Patient has an appointment on Monday at [facility] location and wants to get it done with her Mammogram. Please place order ASAP . It will be an external order.

## 2017-02-01 NOTE — Telephone Encounter (Signed)
Pt request amlodipine refilled to [facility]; done per protocol. Pt voiced understanding.08/2016 last annual exam.

## 2017-02-01 NOTE — Telephone Encounter (Signed)
Pt request cb about status of bone density order and scheduling [phone].

## 2017-02-01 NOTE — Telephone Encounter (Signed)
Orders entered

## 2017-02-16 ENCOUNTER — Encounter: Payer: Self-pay | Admitting: Family Medicine

## 2017-03-15 ENCOUNTER — Ambulatory Visit: Payer: BLUE CROSS/BLUE SHIELD | Admitting: Family Medicine

## 2017-03-16 ENCOUNTER — Telehealth: Payer: Self-pay | Admitting: Family Medicine

## 2017-03-16 ENCOUNTER — Ambulatory Visit (INDEPENDENT_AMBULATORY_CARE_PROVIDER_SITE_OTHER): Payer: BLUE CROSS/BLUE SHIELD | Admitting: Family Medicine

## 2017-03-16 ENCOUNTER — Encounter: Payer: Self-pay | Admitting: Family Medicine

## 2017-03-16 VITALS — BP 118/82 | HR 73 | Temp 98.5°F | Resp 16 | Ht 62.0 in | Wt 136.1 lb

## 2017-03-16 DIAGNOSIS — M8000XA Age-related osteoporosis with current pathological fracture, unspecified site, initial encounter for fracture: Secondary | ICD-10-CM

## 2017-03-16 DIAGNOSIS — I10 Essential (primary) hypertension: Secondary | ICD-10-CM

## 2017-03-16 NOTE — Telephone Encounter (Signed)
Spoke with Shanon Brow @ [facility]/ he stated this is out of their realm  They only do radiology and surgical.

## 2017-03-16 NOTE — Telephone Encounter (Signed)
Dr Deborra Medina  I spoke with [place]  The patient needs to call her insurance to see which one they prefer.

## 2017-03-16 NOTE — Telephone Encounter (Signed)
Left message with [facility] @ [phone] laverine gave me this number   Wandra Feinstein, Robin B        I have no clue. I think that would depend on how much it cost (maybe hospital would know or [facility] at Short Stay) and how much her insurance covers. Pt would need to call her insurance.   Previous Messages    ----- Message -----  From: Darl Householder  Sent: 03/16/2017 11:21 AM  To: Joelyn Oms   Can you help me with this? I have not idea where to start or should I send this to the [place] for Dr Deborra Medina  ----- Message -----  From: Lucille Passy, MD  Sent: 03/16/2017 10:52 AM  To: Darl Householder   Is it possible for you to look into which would be cheaper for Ms [place]- IV boniva or IV reclast?   Thanks so much!

## 2017-03-16 NOTE — Progress Notes (Signed)
Subjective:   Patient ID: Kristen Soto, female    DOB: 07-May-1956, 61 y.o.   MRN: [id number]  Kristen Soto is a pleasant 61 y.o. year old female who presents to clinic today with Follow-up (dexa scan, HTN )  on 03/16/2017  HPI:  Osteoporosis- currently receiving q 6 months prolia. Prior to that, was on evista for years.  Tried fosamax- could not remember to take weekly and she was concerned about GERD symptoms.  Does have h/o breast CA.  HTN- currently taking Lisinopril 40 mg daily and amlodipine 5 mg daily.  BP Readings from Last 3 Encounters:  03/16/17 118/82  08/30/16 134/80  08/17/16 140/74      Current Outpatient Prescriptions on File Prior to Visit  Medication Sig Dispense Refill  . amLODipine (NORVASC) 5 MG tablet TAKE 1 TABLET BY MOUTH DAILY. 90 tablet 2  . denosumab (PROLIA) 60 MG/ML SOLN injection Inject 60 mg into the skin every 6 (six) months. Administer in upper arm, thigh, or abdomen    . lisinopril (PRINIVIL,ZESTRIL) 40 MG tablet TAKE 1 TABLET DAILY 90 tablet 3  . Multiple Vitamin (MULTIVITAMIN) tablet Take 1 tablet by mouth daily.     No current facility-administered medications on file prior to visit.     Allergies  Allergen Reactions  . Diovan [Valsartan]     HA, nausea  . Hctz [Hydrochlorothiazide]     Dysuria, frequency  . Lisinopril     Possible cough    Past Medical History:  Diagnosis Date  . Cancer [facility])    breast  . Hypertension     Past Surgical History:  Procedure Laterality Date  . BASAL CELL CARCINOMA EXCISION  05/2008   removal, left eyelid  . CYSTOSCOPY  02/02/2000   mild urethral trigonitis    Family History  Problem Relation Age of Onset  . Alcohol abuse Mother   . Hypertension Mother   . Graves' disease Mother   . Heart attack Father 24  . Hypertension Father   . Hypertension Sister   . Heart attack Paternal Grandmother   . Kidney disease Paternal Grandmother   . Cancer Paternal Grandmother 34       breast    . Heart attack Paternal Grandfather   . Cancer Sister 39       breast    Social History   Social History  . Marital status: Married    Spouse name: N/A  . Number of children: 1  . Years of education: N/A   Occupational History  . PROJECT MANAGEN [employer]   Social History Main Topics  . Smoking status: Former Research scientist (life sciences)  . Smokeless tobacco: Never Used  . Alcohol use 0.0 oz/week     Comment: wine-occ  . Drug use: No  . Sexual activity: Not on file   Other Topics Concern  . Not on file   Social History Narrative  . No narrative on file   The PMH, PSH, Social History, Family History, Medications, and allergies have been reviewed in [facility], and have been updated if relevant.   Review of Systems  Constitutional: Negative.   Respiratory: Negative.   Cardiovascular: Negative.   Musculoskeletal: Negative.   Neurological: Negative.   Hematological: Negative.   Psychiatric/Behavioral: Negative.   All other systems reviewed and are negative.      Objective:    BP 118/82 (BP Location: Left Arm, Patient Position: Sitting, Cuff Size: Normal)   Pulse 73   Temp 98.5 F (36.9 C) (Oral)  Resp 16   Ht 5\' 2"  (1.575 m)   Wt 136 lb 1.9 oz (61.7 kg)   SpO2 97%   BMI 24.90 kg/m    Physical Exam  Constitutional: She is oriented to person, place, and time. She appears well-developed and well-nourished. No distress.  HENT:  Head: Normocephalic and atraumatic.  Eyes: Conjunctivae are normal.  Cardiovascular: Normal rate and regular rhythm.   Pulmonary/Chest: Effort normal and breath sounds normal.  Musculoskeletal: Normal range of motion. She exhibits no edema.  Neurological: She is alert and oriented to person, place, and time. No cranial nerve deficit.  Skin: Skin is warm and dry. She is not diaphoretic.  Psychiatric: She has a normal mood and affect. Her behavior is normal. Judgment and thought content normal.  Nursing note and vitals reviewed.         Assessment & Plan:    Osteoporosis with current pathological fracture, unspecified osteoporosis type, initial encounter No Follow-up on file.

## 2017-03-16 NOTE — Telephone Encounter (Signed)
Left message asking pt to call office  °

## 2017-03-16 NOTE — Telephone Encounter (Signed)
Please let pt know

## 2017-03-17 NOTE — Telephone Encounter (Signed)
Disregard message about gi referral  Pt didn't have referrral for GI       Only need to answer ? About iv voniva or iv reclast.  Pt needs to call insurance company to see which they prefer

## 2017-03-17 NOTE — Assessment & Plan Note (Signed)
>  25 minutes spent in face to face time with patient, >50% spent in counselling or coordination of care discussing tx options.  Prolia was effective but cost to patient out of pocket was hundreds of dollars. She is interested in either IV boniva or Reclast.  Will ask our office to look into what may be cheaper for her- also advised her to call her insurance company.

## 2017-03-17 NOTE — Telephone Encounter (Signed)
Left message asking pt to call office  °

## 2017-03-17 NOTE — Telephone Encounter (Signed)
Also left message pt has referral for gi also Does she want [place] or [place]  Please see below about previous question

## 2017-03-17 NOTE — Assessment & Plan Note (Signed)
Well controlled.  No changes made. 

## 2017-03-22 NOTE — Telephone Encounter (Signed)
Pt is aware she will need to call her insurance company about iv boniva and iv reclast.

## 2017-06-27 ENCOUNTER — Other Ambulatory Visit: Payer: Self-pay | Admitting: Family Medicine

## 2017-07-05 HISTORY — PX: ELBOW SURGERY: SHX618

## 2017-09-09 ENCOUNTER — Other Ambulatory Visit: Payer: BLUE CROSS/BLUE SHIELD

## 2017-09-13 ENCOUNTER — Encounter: Payer: BLUE CROSS/BLUE SHIELD | Admitting: Family Medicine

## 2017-11-22 ENCOUNTER — Ambulatory Visit: Payer: Self-pay

## 2017-11-22 NOTE — Telephone Encounter (Signed)
TA-Pt was advised to go to ED/thx dmf

## 2017-11-22 NOTE — Telephone Encounter (Signed)
Pt c/o right arm pain and unable to straighten arm. Injury happened on Sunday. Pt picked up a 5 gallon jug of water and then she "slammed" the jug back down on the table. She cannot straighten arm. She stated that she can see slight swelling to the elbow. Pt states that arm does not hurt unless she twits her wrist. No breaks in the skin. Advised pt to go to ED. Pt is in [place] on vacation. Care advice given.  Reason for Disposition . Can't move injured arm normally (bend or straighten completely)    Pt in [place] advised to go to ED.  Answer Assessment - Initial Assessment Questions 1. MECHANISM: "How did the injury happen?"     Picked up 5 gallon jug picked it up then it slammed  2. ONSET: "When did the injury happen?" (Minutes or hours ago)      Sunday 3. LOCATION: "Where is the injury located?"      Right arm 4. APPEARANCE of INJURY: "What does the injury look like?"      Normal appearance  5. SEVERITY: "Can you use the arm normally?"  Cannot straightened 6. SWELLING or BRUISING: "is there any swelling or bruising?" If so, ask: "How large is it? (e.g., inches, centimeters)  "Slight" swelling at the elbow 7. PAIN: "Is there pain?" If so, ask: "How bad is the pain?"    (Scale 1-10; or mild, moderate, severe)     No pain unless twists her wrist 8. TETANUS: For any breaks in the skin, ask: "When was the last tetanus booster?"     no 9. OTHER SYMPTOMS: "Do you have any other symptoms?"  (e.g., numbness in hand)     no 10. PREGNANCY: "Is there any chance you are pregnant?" "When was your last menstrual period?" n/a  Protocols used: ARM INJURY-A-AH

## 2017-11-22 NOTE — Telephone Encounter (Signed)
fyi

## 2018-11-10 ENCOUNTER — Telehealth: Payer: Self-pay | Admitting: General Practice

## 2018-11-10 NOTE — Telephone Encounter (Signed)
Pt said she is no longer seeing Dr Deborra Medina since she left [place], updated profile

## 2019-01-03 LAB — HM MAMMOGRAPHY

## 2019-06-15 ENCOUNTER — Telehealth: Payer: Self-pay

## 2019-06-15 NOTE — Telephone Encounter (Signed)
I left VM for patient to return phone call.   

## 2019-06-15 NOTE — Telephone Encounter (Addendum)
Sure - would be my pleasure Ok to place in open 30 min slot. Thanks.

## 2019-06-15 NOTE — Telephone Encounter (Signed)
Patient contacted the office stating that she used to be a patient of Dr. Deborra Medina until she transferred to another location. Patient states she then went to a [facility] office, and she is not happy with the care she is receiving. Patient states her husband is a patient of Dr. Darnell Level, and she is wondering if he would take her on as a new patient? Please advise. Thanks!

## 2019-06-26 NOTE — Telephone Encounter (Signed)
Appointment scheduled.

## 2019-07-02 ENCOUNTER — Other Ambulatory Visit: Payer: Self-pay

## 2019-07-02 ENCOUNTER — Ambulatory Visit (INDEPENDENT_AMBULATORY_CARE_PROVIDER_SITE_OTHER): Payer: BC Managed Care – PPO | Admitting: Family Medicine

## 2019-07-02 ENCOUNTER — Encounter: Payer: Self-pay | Admitting: Family Medicine

## 2019-07-02 VITALS — BP 148/80 | HR 71 | Temp 98.2°F | Ht 61.0 in | Wt 136.3 lb

## 2019-07-02 DIAGNOSIS — M810 Age-related osteoporosis without current pathological fracture: Secondary | ICD-10-CM

## 2019-07-02 DIAGNOSIS — Z91B Personal risk factor of exposure to diethylstilbestrol: Secondary | ICD-10-CM

## 2019-07-02 DIAGNOSIS — Z9189 Other specified personal risk factors, not elsewhere classified: Secondary | ICD-10-CM

## 2019-07-02 DIAGNOSIS — R0789 Other chest pain: Secondary | ICD-10-CM

## 2019-07-02 DIAGNOSIS — Z853 Personal history of malignant neoplasm of breast: Secondary | ICD-10-CM | POA: Diagnosis not present

## 2019-07-02 DIAGNOSIS — I10 Essential (primary) hypertension: Secondary | ICD-10-CM | POA: Diagnosis not present

## 2019-07-02 DIAGNOSIS — E042 Nontoxic multinodular goiter: Secondary | ICD-10-CM

## 2019-07-02 DIAGNOSIS — E559 Vitamin D deficiency, unspecified: Secondary | ICD-10-CM | POA: Diagnosis not present

## 2019-07-02 HISTORY — DX: Other specified personal risk factors, not elsewhere classified: Z91.89

## 2019-07-02 MED ORDER — CALCIUM-VITAMIN D 600-400 MG-UNIT PO TABS: 1.0000 | ORAL_TABLET | Freq: Every day | ORAL | Status: AC

## 2019-07-02 NOTE — Assessment & Plan Note (Signed)
She is on monthly boniva. Reviewed cal/vit D dosing as well as recommended regular weight bearing exercise - she will work towards regular exercise routine.

## 2019-07-02 NOTE — Patient Instructions (Addendum)
EKG today. Trial pepcid 20mg  or tums at night time for heartburn related symptoms.   Blood pressure is staying elevated - start monitoring more closely at home - and let me know if consistently >140/90.  Ok to try melatonin 5-10mg  at night.  Return as needed or in 3 months for physical.

## 2019-07-02 NOTE — Assessment & Plan Note (Addendum)
Records from our EMR as well as [facility] EMR via care everywhere reviewed:  L lower thyroid nodule biopsy 10/2013 - inconclusive - follicular lesion. Repeat FNA biopsy by [facility] 0000000 - follicular lesion with [id number] chance of cancer, rec monitor clinically by endo with yearly [place].  Thyroid US 05/2017 - highly suspicious RLL thyroid nodule rec biopsy and several smaller nodules rec yearly [place]. R thyroid nodule biopsy 06/2017 - non neoplastic thyroid lesion negative for malignancy.  Thyroid US 05/2018 - L lower thyroid nodule decreasing in size rec yearly [place] Will watch for now. Pt hesitant for rpt imaging at this time.

## 2019-07-02 NOTE — Assessment & Plan Note (Signed)
Story more suggestive of indigestion/heartburn as cause - suggested pepcid 20mg  nightly trial for 2 wks to see effect. If persistent or worsening symptoms, I asked Keira to let me know for further evaluation.

## 2019-07-02 NOTE — Assessment & Plan Note (Signed)
Discussed vit D dosing - rec at least 1000 IU daily. Will check levels next labwork.

## 2019-07-02 NOTE — Assessment & Plan Note (Addendum)
Chronic. BP elevation noted today, she also endorses some elevated readings at home. Advised start monitoring BP more closely, let me know if consistently >140/90 to likely increase amlodipine to 10mg  daily. Discussed possible ankle swelling associated with higher amlodipine dose. Also recommend limiting salt in diet. She agrees with plan.

## 2019-07-02 NOTE — Assessment & Plan Note (Signed)
Guidelines recommend yearly pelvic exam/pap smear of cervix and vagina and visualization of entire vagina due to increased risk of vaginal or cervical clear cell adenocarcinoma.

## 2019-07-02 NOTE — Progress Notes (Signed)
This visit was conducted in person.  BP (!) 148/80 (BP Location: Left Arm, Cuff Size: Normal)   Pulse 71   Temp 98.2 F (36.8 C) (Temporal)   Ht 5\' 1"  (1.549 m)   Wt 136 lb 5 oz (61.8 kg)   SpO2 98%   BMI 25.76 kg/m    CC: transfer of care previously saw Dr Deborra Medina then [facility] clinic Subjective:    Patient ID: Kristen Soto, female    DOB: 1956/03/18, 63 y.o.   MRN: [id number]  HPI: Kristen Soto is a 63 y.o. female presenting on 07/02/2019 for Transitions Of Care   HTN - Compliant with current antihypertensive regimen of amlodipine 5mg , lisinopril 40mg  daily. Does not regularly check blood pressures at home: last check elevated. No low blood pressure readings.  Denies HA, vision changes, SOB, leg swelling.   Occasional chest discomfort attributed to heartburn - described as indigestion, no pressure/tightness, not exertional, noticing increasing symptoms recently after large dinner and 2 glasses of wine. Hasn't tried anything for this. Father had MI age 107.   Osteoporosis - managed with monthly boniva, calcium and vit D supplementation. Could do better with walking routine. Previously on evista then prolia (very expensive)  H/o thyroid nodules 15+ yrs stable per pt - has had both sides biopsied, latest thyroid US reviewed 04/2018 from [facility].   H/o breast cancer ~2006 s/p remote chemotherapy, gets yearly mammograms.   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      Relevant past medical, surgical, family and social history reviewed and updated as indicated. Interim medical history since our last visit reviewed. Allergies and medications reviewed and updated. Outpatient Medications Prior to Visit  Medication Sig Dispense Refill  . amLODipine (NORVASC) 5 MG tablet TAKE 1 TABLET DAILY 90 tablet 1  . ibandronate (BONIVA) 150 MG tablet Take 150 mg by mouth every 30 (thirty) days. Take in the morning with a full  glass of water, on an empty stomach, and do not take anything else by mouth or lie down for the next 30 min.    Marland Kitchen lisinopril (PRINIVIL,ZESTRIL) 40 MG tablet TAKE 1 TABLET DAILY 90 tablet 1  . Multiple Vitamin (MULTIVITAMIN) tablet Take 1 tablet by mouth daily.    Marland Kitchen CALCIUM PO Take by mouth daily.     No facility-administered medications prior to visit.     Per HPI unless specifically indicated in ROS section below Review of Systems Objective:    BP (!) 148/80 (BP Location: Left Arm, Cuff Size: Normal)   Pulse 71   Temp 98.2 F (36.8 C) (Temporal)   Ht 5\' 1"  (1.549 m)   Wt 136 lb 5 oz (61.8 kg)   SpO2 98%   BMI 25.76 kg/m   Wt Readings from Last 3 Encounters:  07/02/19 136 lb 5 oz (61.8 kg)  03/16/17 136 lb 1.9 oz (61.7 kg)  08/30/16 137 lb (62.1 kg)    Physical Exam Vitals and nursing note reviewed.  Constitutional:      General: She is not in acute distress.    Appearance: Normal appearance. She is not ill-appearing.  Eyes:     Extraocular Movements: Extraocular movements intact.     Conjunctiva/sclera: Conjunctivae normal.     Pupils: Pupils are equal, round, and reactive to light.  Neck:     Thyroid: Thyroid mass (small nodules bilaterally) present. No thyromegaly or thyroid  tenderness.     Vascular: No carotid bruit.  Cardiovascular:     Rate and Rhythm: Normal rate and regular rhythm.     Pulses: Normal pulses.     Heart sounds: Normal heart sounds. No murmur.  Pulmonary:     Effort: Pulmonary effort is normal. No respiratory distress.     Breath sounds: Normal breath sounds. No wheezing, rhonchi or rales.  Lymphadenopathy:     Cervical: No cervical adenopathy.  Skin:    General: Skin is warm.     Findings: No rash.  Neurological:     General: No focal deficit present.     Mental Status: She is alert.  Psychiatric:        Mood and Affect: Mood normal.        Behavior: Behavior normal.        EKG - sinus bradycardia, normal axis, intervals, no acute  ST/T changes, no old to compare Assessment & Plan:  This visit occurred during the SARS-CoV-2 public health emergency.  Safety protocols were in place, including screening questions prior to the visit, additional usage of staff PPE, and extensive cleaning of exam room while observing appropriate contact time as indicated for disinfecting solutions.  Problem List Items Addressed This Visit    Vitamin D deficiency    Discussed vit D dosing - rec at least 1000 IU daily. Will check levels next labwork.       Osteoporosis    She is on monthly boniva. Reviewed cal/vit D dosing as well as recommended regular weight bearing exercise - she will work towards regular exercise routine.       Relevant Medications   ibandronate (BONIVA) 150 MG tablet   Calcium Carb-Cholecalciferol (CALCIUM-VITAMIN D) 600-400 MG-UNIT TABS   Multiple thyroid nodules    Records from our EMR as well as [facility] EMR via care everywhere reviewed:  L lower thyroid nodule biopsy 10/2013 - inconclusive - follicular lesion. Repeat FNA biopsy by [facility] 0000000 - follicular lesion with [id number] chance of cancer, rec monitor clinically by endo with yearly [place].  Thyroid US 05/2017 - highly suspicious RLL thyroid nodule rec biopsy and several smaller nodules rec yearly [place]. R thyroid nodule biopsy 06/2017 - non neoplastic thyroid lesion negative for malignancy.  Thyroid US 05/2018 - L lower thyroid nodule decreasing in size rec yearly [place] Will watch for now. Pt hesitant for rpt imaging at this time.       Essential hypertension - Primary    Chronic. BP elevation noted today, she also endorses some elevated readings at home. Advised start monitoring BP more closely, let me know if consistently >140/90 to likely increase amlodipine to 10mg  daily. Discussed possible ankle swelling associated with higher amlodipine dose. Also recommend limiting salt in diet. She agrees with plan.       Relevant Orders   EKG 12-Lead (Completed)   DES exposure in  utero    Guidelines recommend yearly pelvic exam/pap smear of cervix and vagina and visualization of entire vagina due to increased risk of vaginal or cervical clear cell adenocarcinoma.       Chest discomfort    Story more suggestive of indigestion/heartburn as cause - suggested pepcid 20mg  nightly trial for 2 wks to see effect. If persistent or worsening symptoms, I asked Leshay to let me know for further evaluation.       BREAST CANCER, HX OF       Meds ordered this encounter  Medications  . Calcium Carb-Cholecalciferol (CALCIUM-VITAMIN  D) 600-400 MG-UNIT TABS    Sig: Take 1 tablet by mouth daily.    Dispense:  60 tablet   Orders Placed This Encounter  Procedures  . EKG 12-Lead    Patient Instructions  EKG today. Trial pepcid 20mg  or tums at night time for heartburn related symptoms.   Blood pressure is staying elevated - start monitoring more closely at home - and let me know if consistently >140/90.  Ok to try melatonin 5-10mg  at night.  Return as needed or in 3 months for physical.    Follow up plan: Return in about 3 months (around 09/30/2019) for annual exam, prior fasting for blood work.  Ria Bush, MD

## 2019-09-20 ENCOUNTER — Other Ambulatory Visit: Payer: Self-pay | Admitting: Family Medicine

## 2019-09-20 DIAGNOSIS — E559 Vitamin D deficiency, unspecified: Secondary | ICD-10-CM

## 2019-09-20 DIAGNOSIS — M810 Age-related osteoporosis without current pathological fracture: Secondary | ICD-10-CM

## 2019-09-20 DIAGNOSIS — E042 Nontoxic multinodular goiter: Secondary | ICD-10-CM

## 2019-09-20 DIAGNOSIS — I10 Essential (primary) hypertension: Secondary | ICD-10-CM

## 2019-09-21 ENCOUNTER — Other Ambulatory Visit: Payer: BC Managed Care – PPO

## 2019-09-21 ENCOUNTER — Other Ambulatory Visit (INDEPENDENT_AMBULATORY_CARE_PROVIDER_SITE_OTHER): Payer: BC Managed Care – PPO

## 2019-09-21 ENCOUNTER — Other Ambulatory Visit: Payer: Self-pay

## 2019-09-21 DIAGNOSIS — E042 Nontoxic multinodular goiter: Secondary | ICD-10-CM | POA: Diagnosis not present

## 2019-09-21 DIAGNOSIS — I10 Essential (primary) hypertension: Secondary | ICD-10-CM

## 2019-09-21 DIAGNOSIS — M810 Age-related osteoporosis without current pathological fracture: Secondary | ICD-10-CM | POA: Diagnosis not present

## 2019-09-21 LAB — BASIC METABOLIC PANEL
BUN: 20 mg/dL (ref 6–23)
CO2: 28 mEq/L (ref 19–32)
Calcium: 9.2 mg/dL (ref 8.4–10.5)
Chloride: 106 mEq/L (ref 96–112)
Creatinine, Ser: 0.85 mg/dL (ref 0.40–1.20)
GFR: 67.57 mL/min (ref >60.00)
Glucose, Bld: 85 mg/dL (ref 70–99)
Potassium: 3.7 mEq/L (ref 3.5–5.1)
Sodium: 141 mEq/L (ref 135–145)

## 2019-09-21 LAB — LIPID PANEL
Cholesterol: 206 mg/dL — ABNORMAL HIGH (ref 0–200)
HDL: 70.1 mg/dL (ref >39.00)
LDL Cholesterol: 122 mg/dL — ABNORMAL HIGH (ref 0–99)
NonHDL: 135.7
Total CHOL/HDL Ratio: 3
Triglycerides: 70 mg/dL (ref 0.0–149.0)
VLDL: 14 mg/dL (ref 0.0–40.0)

## 2019-09-21 LAB — TSH: TSH: 1.09 u[IU]/mL (ref 0.35–4.50)

## 2019-09-21 LAB — VITAMIN D 25 HYDROXY (VIT D DEFICIENCY, FRACTURES): VITD: 55.2 ng/mL (ref 30.00–100.00)

## 2019-09-24 ENCOUNTER — Encounter: Payer: Self-pay | Admitting: Family Medicine

## 2019-09-24 ENCOUNTER — Ambulatory Visit (INDEPENDENT_AMBULATORY_CARE_PROVIDER_SITE_OTHER): Payer: BC Managed Care – PPO | Admitting: Family Medicine

## 2019-09-24 ENCOUNTER — Other Ambulatory Visit: Payer: Self-pay

## 2019-09-24 ENCOUNTER — Ambulatory Visit (INDEPENDENT_AMBULATORY_CARE_PROVIDER_SITE_OTHER)
Admission: RE | Admit: 2019-09-24 | Discharge: 2019-09-24 | Disposition: A | Discharge status: Home / Self Care | Payer: BC Managed Care – PPO | Source: Ambulatory Visit | Attending: Family Medicine | Admitting: Family Medicine

## 2019-09-24 ENCOUNTER — Other Ambulatory Visit (INDEPENDENT_AMBULATORY_CARE_PROVIDER_SITE_OTHER): Payer: BC Managed Care – PPO

## 2019-09-24 VITALS — BP 126/82 | HR 71 | Temp 97.6°F | Ht 61.0 in | Wt 135.4 lb

## 2019-09-24 DIAGNOSIS — E559 Vitamin D deficiency, unspecified: Secondary | ICD-10-CM | POA: Diagnosis not present

## 2019-09-24 DIAGNOSIS — M545 Low back pain, unspecified: Secondary | ICD-10-CM

## 2019-09-24 DIAGNOSIS — Z853 Personal history of malignant neoplasm of breast: Secondary | ICD-10-CM

## 2019-09-24 DIAGNOSIS — Z1211 Encounter for screening for malignant neoplasm of colon: Secondary | ICD-10-CM | POA: Diagnosis not present

## 2019-09-24 DIAGNOSIS — Z0000 Encounter for general adult medical examination without abnormal findings: Secondary | ICD-10-CM

## 2019-09-24 DIAGNOSIS — E042 Nontoxic multinodular goiter: Secondary | ICD-10-CM

## 2019-09-24 DIAGNOSIS — I10 Essential (primary) hypertension: Secondary | ICD-10-CM

## 2019-09-24 DIAGNOSIS — M810 Age-related osteoporosis without current pathological fracture: Secondary | ICD-10-CM

## 2019-09-24 DIAGNOSIS — Z9189 Other specified personal risk factors, not elsewhere classified: Secondary | ICD-10-CM

## 2019-09-24 LAB — POC URINALSYSI DIPSTICK (AUTOMATED)
Bilirubin, UA: NEGATIVE
Blood, UA: NEGATIVE
Glucose, UA: NEGATIVE
Ketones, UA: NEGATIVE
Leukocytes, UA: NEGATIVE
Nitrite, UA: NEGATIVE
Protein, UA: NEGATIVE
Spec Grav, UA: 1.025 (ref 1.010–1.025)
Urobilinogen, UA: 0.2 E.U./dL
pH, UA: 6 (ref 5.0–8.0)

## 2019-09-24 LAB — HEPATIC FUNCTION PANEL
ALT: 18 U/L (ref 0–35)
AST: 20 U/L (ref 0–37)
Albumin: 4.4 g/dL (ref 3.5–5.2)
Alkaline Phosphatase: 59 U/L (ref 39–117)
Bilirubin, Direct: 0.1 mg/dL (ref 0.0–0.3)
Total Bilirubin: 0.5 mg/dL (ref 0.2–1.2)
Total Protein: 7.1 g/dL (ref 6.0–8.3)

## 2019-09-24 NOTE — Assessment & Plan Note (Addendum)
Preventative protocols reviewed and updated unless pt declined. Discussed healthy diet and lifestyle.  We reviewed recent covid vaccines - I did recommend she try and get vaccines prior to upcoming [place] trip.

## 2019-09-24 NOTE — Progress Notes (Signed)
This visit was conducted in person.  BP 126/82 (BP Location: Left Arm, Patient Position: Sitting, Cuff Size: Normal)   Pulse 71   Temp 97.6 F (36.4 C) (Temporal)   Ht '5\' 1"'$  (1.549 m)   Wt 135 lb 6 oz (61.4 kg)   SpO2 98%   BMI 25.58 kg/m    CC: CPE Subjective:    Patient ID: Kristen Soto, female    DOB: 01-28-1956, 64 y.o.   MRN: [id number]  HPI: Kristen Soto is a 64 y.o. female presenting on 09/24/2019 for Annual Exam   Last CPE 2020 at [facility].   Ongoing back and R flank pain for last 3 months - painful to lay on that side. No L flank pain. Intermittent. No urinary changes. No h/o kidney stones. Worse with heavy exertion. Worse with prolonged standing. Hasn't tried anything for this besides heating pad occasionally.   Preventative: Colon cancer screening - normal colonoscopy at [facility] (I can't see report) 12/2007. Pt would like to do yearly stool kits.  H/o breast cancer ~2006 s/p remote chemotherapy - gets yearly mammograms at [facility] imaging, latest mammo 01/2019 [facility]. Needs order yearly - would like 3D screening mammogram at [facility]).  Well woman exam - recommended frequent paps due to DES exposure in utero. Guidelines recommend yearly pelvic exam/pap smear of cervix and vagina and visualization of entire vagina due to increased risk of vaginal or cervical clear cell adenocarcinoma. She declines this year but will check next year - always normal in the past.  Lung cancer screening - not eligible DEXA scan - 2018 - h/o osteoporosis managed on monthly boniva (sometimes forgets) and regular calcium, vit D, weight bearing exercise. Previously on prolia. We will price out prolia.  Flu shot - declines  Tdap 2014  Pneumonia shot - not due yet Shingrix - completed  COVID vaccine - discussed. Advanced directive discussion -  Seat belt use discussed Sunscreen use discussed. No changing moles on skin. Sees derm regularly.  Ex smoker, quit 30 yrs ago. 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 Interim medical history since our last visit reviewed. Allergies and medications reviewed and updated. Outpatient Medications Prior to Visit  Medication Sig Dispense Refill  . amLODipine (NORVASC) 5 MG tablet TAKE 1 TABLET DAILY 90 tablet 1  . Calcium Carb-Cholecalciferol (CALCIUM-VITAMIN D) 600-400 MG-UNIT TABS Take 1 tablet by mouth daily. 60 tablet   . ibandronate (BONIVA) 150 MG tablet Take 150 mg by mouth every 30 (thirty) days. Take in the morning with a full glass of water, on an empty stomach, and do not take anything else by mouth or lie down for the next 30 min.    Marland Kitchen lisinopril (PRINIVIL,ZESTRIL) 40 MG tablet TAKE 1 TABLET DAILY 90 tablet 1  . Multiple Vitamin (MULTIVITAMIN) tablet Take 1 tablet by mouth daily.     No facility-administered medications prior to visit.     Per HPI unless specifically indicated in ROS section below Review of Systems  Constitutional: Negative for activity change, appetite change, chills, fatigue, fever and unexpected weight change.  HENT: Negative for hearing loss.   Eyes: Negative for visual disturbance.  Respiratory: Negative for cough, chest tightness, shortness  of breath and wheezing.   Cardiovascular: Negative for chest pain, palpitations and leg swelling.  Gastrointestinal: Negative for abdominal distention, abdominal pain, blood in stool, constipation, diarrhea, nausea and vomiting.  Genitourinary: Negative for difficulty urinating and hematuria.  Musculoskeletal: Negative for arthralgias, myalgias and neck pain.  Skin: Negative for rash.  Neurological: Negative for dizziness, seizures, syncope and headaches.  Hematological: Negative for  adenopathy. Does not bruise/bleed easily.  Psychiatric/Behavioral: Negative for dysphoric mood. The patient is not nervous/anxious.    Objective:    BP 126/82 (BP Location: Left Arm, Patient Position: Sitting, Cuff Size: Normal)   Pulse 71   Temp 97.6 F (36.4 C) (Temporal)   Ht '5\' 1"'$  (1.549 m)   Wt 135 lb 6 oz (61.4 kg)   SpO2 98%   BMI 25.58 kg/m   Wt Readings from Last 3 Encounters:  09/24/19 135 lb 6 oz (61.4 kg)  07/02/19 136 lb 5 oz (61.8 kg)  03/16/17 136 lb 1.9 oz (61.7 kg)    Physical Exam Vitals and nursing note reviewed.  Constitutional:      General: She is not in acute distress.    Appearance: Normal appearance. She is well-developed. She is not ill-appearing.  HENT:     Head: Normocephalic and atraumatic.     Right Ear: Hearing, tympanic membrane, ear canal and external ear normal.     Left Ear: Hearing, tympanic membrane, ear canal and external ear normal.     Mouth/Throat:     Pharynx: Uvula midline.  Eyes:     General: No scleral icterus.    Extraocular Movements: Extraocular movements intact.     Conjunctiva/sclera: Conjunctivae normal.     Pupils: Pupils are equal, round, and reactive to light.  Cardiovascular:     Rate and Rhythm: Normal rate and regular rhythm.     Pulses: Normal pulses.          Radial pulses are 2+ on the right side and 2+ on the left side.     Heart sounds: Normal heart sounds. No murmur.  Pulmonary:     Effort: Pulmonary effort is normal. No respiratory distress.     Breath sounds: Normal breath sounds. No wheezing, rhonchi or rales.  Abdominal:     General: Abdomen is flat. Bowel sounds are normal. There is no distension.     Palpations: Abdomen is soft. There is no mass.     Tenderness: There is no abdominal tenderness. There is no guarding or rebound.     Hernia: No hernia is present.  Musculoskeletal:        General: Normal range of motion.     Cervical back: Normal range of motion and neck supple.     Right lower leg:  No edema.     Left lower leg: No edema.  Lymphadenopathy:     Cervical: No cervical adenopathy.  Skin:    General: Skin is warm and dry.     Findings: No rash.  Neurological:     General: No focal deficit present.     Mental Status: She is alert and oriented to person, place, and time.     Comments: CN grossly intact, station and gait intact  Psychiatric:        Mood and Affect: Mood normal.        Behavior: Behavior normal.        Thought Content: Thought content normal.        Judgment: Judgment normal.  Results for orders placed or performed in visit on 09/24/19  HM MAMMOGRAPHY  Result Value Ref Range   HM Mammogram 0-4 Bi-Rad 0-4 Bi-Rad, Self Reported Normal  POCT Urinalysis Dipstick (Automated)  Result Value Ref Range   Color, UA yellow    Clarity, UA clear    Glucose, UA Negative Negative   Bilirubin, UA negative    Ketones, UA negative    Spec Grav, UA 1.025 1.010 - 1.025   Blood, UA negative    pH, UA 6.0 5.0 - 8.0   Protein, UA Negative Negative   Urobilinogen, UA 0.2 0.2 or 1.0 E.U./dL   Nitrite, UA negative    Leukocytes, UA Negative Negative   Assessment & Plan:  This visit occurred during the SARS-CoV-2 public health emergency.  Safety protocols were in place, including screening questions prior to the visit, additional usage of staff PPE, and extensive cleaning of exam room while observing appropriate contact time as indicated for disinfecting solutions.   Problem List Items Addressed This Visit    Vitamin D deficiency    Levels at goal with vit D in calcium tablet and MVI - continue.       Osteoporosis    Continues monthly boniva. On bisphosphonate for several years. Will price out prolia for patient - she would be interested in Q6 mo injection in place of monthly injection.       Multiple thyroid nodules    Declines recheck at this time.       Lumbar back pain    Describes both intermittent lumbar back pain as well as R flank pain. Check  lumbar films as well as urinalysis today.  Will suggest tylenol PRN discomfort. Update if persistent to consider further evaluation (ie abd [place])      Relevant Orders   DG Lumbar Spine Complete (Completed)   POCT Urinalysis Dipstick (Automated) (Completed)   Health maintenance examination - Primary    Preventative protocols reviewed and updated unless pt declined. Discussed healthy diet and lifestyle.  We reviewed recent covid vaccines - I did recommend she try and get vaccines prior to upcoming [place] trip.       Relevant Orders   POCT Urinalysis Dipstick (Automated) (Completed)   Essential hypertension    Chronic, stable. Continue current regimen.       DES exposure in utero    Reviewed recommendation for yearly pelvic/pap including vaginal pap due to increased risk of vaginal or cervical clear cell adenocarcinoma - she desires to defer this year, agrees to repeat GYN exam next year.      BREAST CANCER, HX OF    Gets yearly mammogram at [facility] imaging.        Other Visit Diagnoses    Special screening for malignant neoplasms, colon       Relevant Orders   Fecal occult blood, imunochemical       No orders of the defined types were placed in this encounter.  Orders Placed This Encounter  Procedures  . Fecal occult blood, imunochemical    Standing Status:   Future    Standing Expiration Date:   09/23/2020  . HM MAMMOGRAPHY    This external order was created through the Results Console.  . DG Lumbar Spine Complete    Standing Status:   Future    Number of Occurrences:   1    Standing Expiration Date:   11/23/2020    Order Specific Question:   Reason for Exam (SYMPTOM  OR DIAGNOSIS  REQUIRED)    Answer:   upper lumbar spine with radiation to right    Order Specific Question:   Preferred imaging location?    Answer:   [facility]    Order Specific Question:   Radiology Contrast Protocol - do NOT remove file path    Answer:    [email or web address]  . POCT Urinalysis Dipstick (Automated)    Patient instructions: Pass by lab to pick up stool kit.  Try calling [facility] when due to schedule your mammogram. If any trouble, let us know and I will order 3D screening mammo: [facility] in [place] [phone]  I like the pfizer and moderna vaccines - check online at [email or web address] or at [email or web address].  Urinalysis today, lumbar xrays today. We will be in touch with results.  Will see if we can add liver function to blood work. Return as needed or in 1 year for next physical.   Follow up plan: Return in about 1 year (around 09/23/2020) for annual exam, prior fasting for blood work.  Ria Bush, MD

## 2019-09-24 NOTE — Patient Instructions (Addendum)
Pass by lab to pick up stool kit.  Try calling [facility] when due to schedule your mammogram. If any trouble, let us know and I will order 3D screening mammo: [facility] in [place] [phone]  I like the pfizer and moderna vaccines - check online at [email or web address] or at [email or web address].  Urinalysis today, lumbar xrays today. We will be in touch with results.  Will see if we can add liver function to blood work. Return as needed or in 1 year for next physical.   Health Maintenance for Postmenopausal Women Menopause is a normal process in which your ability to get pregnant comes to an end. This process happens slowly over many months or years, usually between the ages of 69 and 61. Menopause is complete when you have missed your menstrual periods for 12 months. It is important to talk with your health care provider about some of the most common conditions that affect women after menopause (postmenopausal women). These include heart disease, cancer, and bone loss (osteoporosis). Adopting a healthy lifestyle and getting preventive care can help to promote your health and wellness. The actions you take can also lower your chances of developing some of these common conditions. What should I know about menopause? During menopause, you may get a number of symptoms, such as:  Hot flashes. These can be moderate or severe.  Night sweats.  Decrease in sex drive.  Mood swings.  Headaches.  Tiredness.  Irritability.  Memory problems.  Insomnia. Choosing to treat or not to treat these symptoms is a decision that you make with your health care provider. Do I need hormone replacement therapy?  Hormone replacement therapy is effective in treating symptoms that are caused by menopause, such as hot flashes and night sweats.  Hormone replacement carries certain risks, especially as you become older. If you are thinking about using estrogen or estrogen with progestin, discuss the benefits and risks  with your health care provider. What is my risk for heart disease and stroke? The risk of heart disease, heart attack, and stroke increases as you age. One of the causes may be a change in the body's hormones during menopause. This can affect how your body uses dietary fats, triglycerides, and cholesterol. Heart attack and stroke are medical emergencies. There are many things that you can do to help prevent heart disease and stroke. Watch your blood pressure  High blood pressure causes heart disease and increases the risk of stroke. This is more likely to develop in people who have high blood pressure readings, are of African descent, or are overweight.  Have your blood pressure checked: ? Every 3-5 years if you are 42-26 years of age. ? Every year if you are 19 years old or older. Eat a healthy diet   Eat a diet that includes plenty of vegetables, fruits, low-fat dairy products, and lean protein.  Do not eat a lot of foods that are high in solid fats, added sugars, or sodium. Get regular exercise Get regular exercise. This is one of the most important things you can do for your health. Most adults should:  Try to exercise for at least 150 minutes each week. The exercise should increase your heart rate and make you sweat (moderate-intensity exercise).  Try to do strengthening exercises at least twice each week. Do these in addition to the moderate-intensity exercise.  Spend less time sitting. Even light physical activity can be beneficial. Other tips  Work with your health care provider to achieve or maintain  a healthy weight.  Do not use any products that contain nicotine or tobacco, such as cigarettes, e-cigarettes, and chewing tobacco. If you need help quitting, ask your health care provider.  Know your numbers. Ask your health care provider to check your cholesterol and your blood sugar (glucose). Continue to have your blood tested as directed by your health care provider. Do I  need screening for cancer? Depending on your health history and family history, you may need to have cancer screening at different stages of your life. This may include screening for:  Breast cancer.  Cervical cancer.  Lung cancer.  Colorectal cancer. What is my risk for osteoporosis? After menopause, you may be at increased risk for osteoporosis. Osteoporosis is a condition in which bone destruction happens more quickly than new bone creation. To help prevent osteoporosis or the bone fractures that can happen because of osteoporosis, you may take the following actions:  If you are 36-82 years old, get at least 1,000 mg of calcium and at least 600 mg of vitamin D per day.  If you are older than age 92 but younger than age 42, get at least 1,200 mg of calcium and at least 600 mg of vitamin D per day.  If you are older than age 43, get at least 1,200 mg of calcium and at least 800 mg of vitamin D per day. Smoking and drinking excessive alcohol increase the risk of osteoporosis. Eat foods that are rich in calcium and vitamin D, and do weight-bearing exercises several times each week as directed by your health care provider. How does menopause affect my mental health? Depression may occur at any age, but it is more common as you become older. Common symptoms of depression include:  Low or sad mood.  Changes in sleep patterns.  Changes in appetite or eating patterns.  Feeling an overall lack of motivation or enjoyment of activities that you previously enjoyed.  Frequent crying spells. Talk with your health care provider if you think that you are experiencing depression. General instructions See your health care provider for regular wellness exams and vaccines. This may include:  Scheduling regular health, dental, and eye exams.  Getting and maintaining your vaccines. These include: ? Influenza vaccine. Get this vaccine each year before the flu season begins. ? Pneumonia  vaccine. ? Shingles vaccine. ? Tetanus, diphtheria, and pertussis (Tdap) booster vaccine. Your health care provider may also recommend other immunizations. Tell your health care provider if you have ever been abused or do not feel safe at home. Summary  Menopause is a normal process in which your ability to get pregnant comes to an end.  This condition causes hot flashes, night sweats, decreased interest in sex, mood swings, headaches, or lack of sleep.  Treatment for this condition may include hormone replacement therapy.  Take actions to keep yourself healthy, including exercising regularly, eating a healthy diet, watching your weight, and checking your blood pressure and blood sugar levels.  Get screened for cancer and depression. Make sure that you are up to date with all your vaccines. This information is not intended to replace advice given to you by your health care provider. Make sure you discuss any questions you have with your health care provider. Document Revised: 06/14/2018 Document Reviewed: 06/14/2018 Elsevier Patient Education  2020 [employer].

## 2019-09-25 ENCOUNTER — Telehealth: Payer: Self-pay | Admitting: Family Medicine

## 2019-09-25 DIAGNOSIS — M545 Low back pain, unspecified: Secondary | ICD-10-CM | POA: Insufficient documentation

## 2019-09-25 NOTE — Assessment & Plan Note (Signed)
Declines recheck at this time.

## 2019-09-25 NOTE — Assessment & Plan Note (Signed)
Continues monthly boniva. On bisphosphonate for several years. Will price out prolia for patient - she would be interested in Q6 mo injection in place of monthly injection.

## 2019-09-25 NOTE — Assessment & Plan Note (Signed)
Reviewed recommendation for yearly pelvic/pap including vaginal pap due to increased risk of vaginal or cervical clear cell adenocarcinoma - she desires to defer this year, agrees to repeat GYN exam next year.

## 2019-09-25 NOTE — Assessment & Plan Note (Signed)
Chronic, stable. Continue current regimen. 

## 2019-09-25 NOTE — Assessment & Plan Note (Signed)
Gets yearly mammogram at [facility].

## 2019-09-25 NOTE — Assessment & Plan Note (Signed)
Levels at goal with vit D in calcium tablet and MVI - continue.

## 2019-09-25 NOTE — Assessment & Plan Note (Addendum)
Describes both intermittent lumbar back pain as well as R flank pain. Check lumbar films as well as urinalysis today.  Will suggest tylenol PRN discomfort. Update if persistent to consider further evaluation (ie abd [place])

## 2019-09-25 NOTE — Telephone Encounter (Signed)
Can we price out prolia for patient? Thanks.  She may be interested if affordable.

## 2019-09-28 ENCOUNTER — Telehealth: Payer: Self-pay

## 2019-09-28 DIAGNOSIS — M810 Age-related osteoporosis without current pathological fracture: Secondary | ICD-10-CM

## 2019-09-28 NOTE — Telephone Encounter (Signed)
Pt will be a "new start" for Rx Prolia.  Pt has a [employer]  No prior auth for plan for Prolia.  Pt has met $167.79 of her $3,000 deductible.  Pt would be responsible for entire cost of Prolia and admin fee.  Pt can apply for a copay card thru [email or web address].  If approved, this card would have $1500 (yearly amount) to use towards purchase of Prolia (Rx only).  Pt would also be responsible for $25 admin fee.  If pt has pharmacy benefits would need policy info to check benefits.  We could send Rx to either [facility] (or specialty pharmacy if pt has benefits) for least complicated use of copay card for pt.

## 2019-09-30 ENCOUNTER — Encounter: Payer: Self-pay | Admitting: Family Medicine

## 2019-09-30 DIAGNOSIS — E785 Hyperlipidemia, unspecified: Secondary | ICD-10-CM | POA: Insufficient documentation

## 2019-10-02 ENCOUNTER — Other Ambulatory Visit (INDEPENDENT_AMBULATORY_CARE_PROVIDER_SITE_OTHER): Payer: BC Managed Care – PPO

## 2019-10-02 DIAGNOSIS — Z1211 Encounter for screening for malignant neoplasm of colon: Secondary | ICD-10-CM | POA: Diagnosis not present

## 2019-10-02 LAB — FECAL OCCULT BLOOD, IMMUNOCHEMICAL: Fecal Occult Bld: NEGATIVE

## 2019-10-02 NOTE — Telephone Encounter (Signed)
Contacted pt and advised of cost. Pt reports this was a problem in the past and cannot afford it. She said she even had inquired about the Prolia card but it would only cover part of it and she would still have to pay over $700. She said they have also checked her pharmacy benefit in the past and it was still too expensive for her.  She wants to just continue with Boniva for now. Advised if anything changed to contact office. Pt verbalized understanding and was appreciative.

## 2019-10-02 NOTE — Telephone Encounter (Addendum)
Noted. Thanks.

## 2019-10-02 NOTE — Assessment & Plan Note (Deleted)
Monthly boniva - forgets Prolia unaffordable (priced out 09/2019, see phone note)

## 2020-01-15 LAB — HM MAMMOGRAPHY

## 2020-01-18 ENCOUNTER — Encounter: Payer: Self-pay | Admitting: Family Medicine

## 2020-03-03 ENCOUNTER — Telehealth: Payer: Self-pay | Admitting: Family Medicine

## 2020-03-03 MED ORDER — LISINOPRIL 40 MG PO TABS: 40.0000 mg | ORAL_TABLET | Freq: Every day | ORAL | 0 refills | Status: DC

## 2020-03-03 MED ORDER — AMLODIPINE BESYLATE 5 MG PO TABS: ORAL_TABLET | ORAL | 0 refills | Status: DC

## 2020-03-03 MED ORDER — LISINOPRIL 40 MG PO TABS: 40.0000 mg | ORAL_TABLET | Freq: Every day | ORAL | 2 refills | Status: DC

## 2020-03-03 MED ORDER — AMLODIPINE BESYLATE 5 MG PO TABS: ORAL_TABLET | ORAL | 2 refills | Status: DC

## 2020-03-03 NOTE — Telephone Encounter (Signed)
E-scribed partial refill.  Spoke with pt notifying her a 14-day supply was sent to [facility] [place].  Verbalizes understanding and request a refill be sent to [facility].   E-scribed refill to [facility].

## 2020-03-03 NOTE — Telephone Encounter (Signed)
Patient called.  She went to the [facility] and forgot to take Lisinopril 40 mg and Amlodipine 5 mg.  [place] Patient's requesting 2 weeks be sent to [facility].

## 2020-05-14 IMAGING — DX DG LUMBAR SPINE COMPLETE 4+V
5 series · 5 of 5 positions shown · non-contrast
Comparison: None.

CLINICAL DATA: Right-sided upper lumbar pain.

EXAM:
LUMBAR SPINE - COMPLETE 4+ VIEW

[l-spine ap]
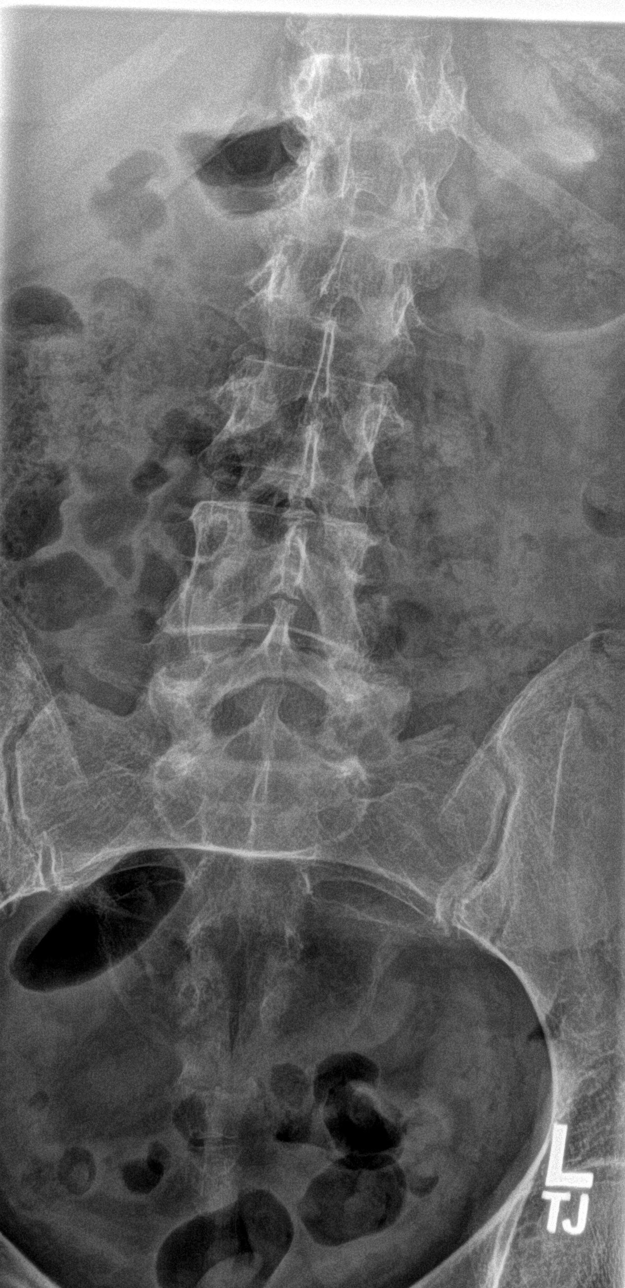

[l-spine obl (1 of 2)]
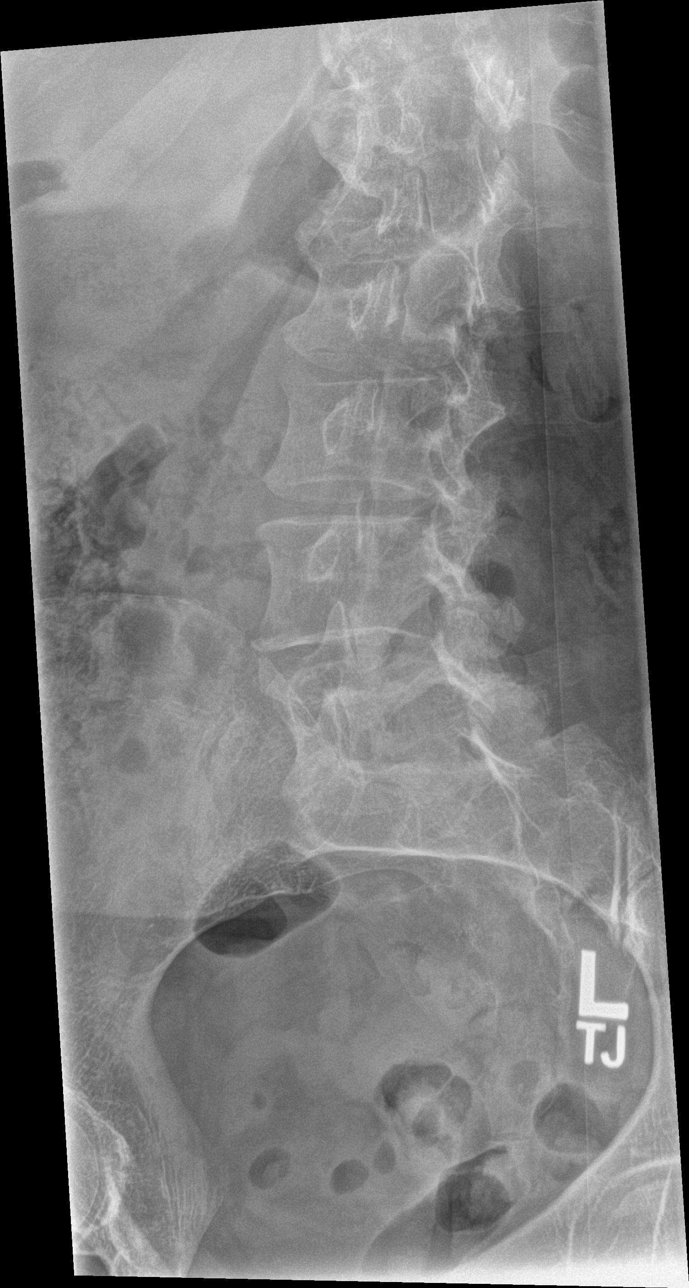

[l-spine obl (2 of 2)]
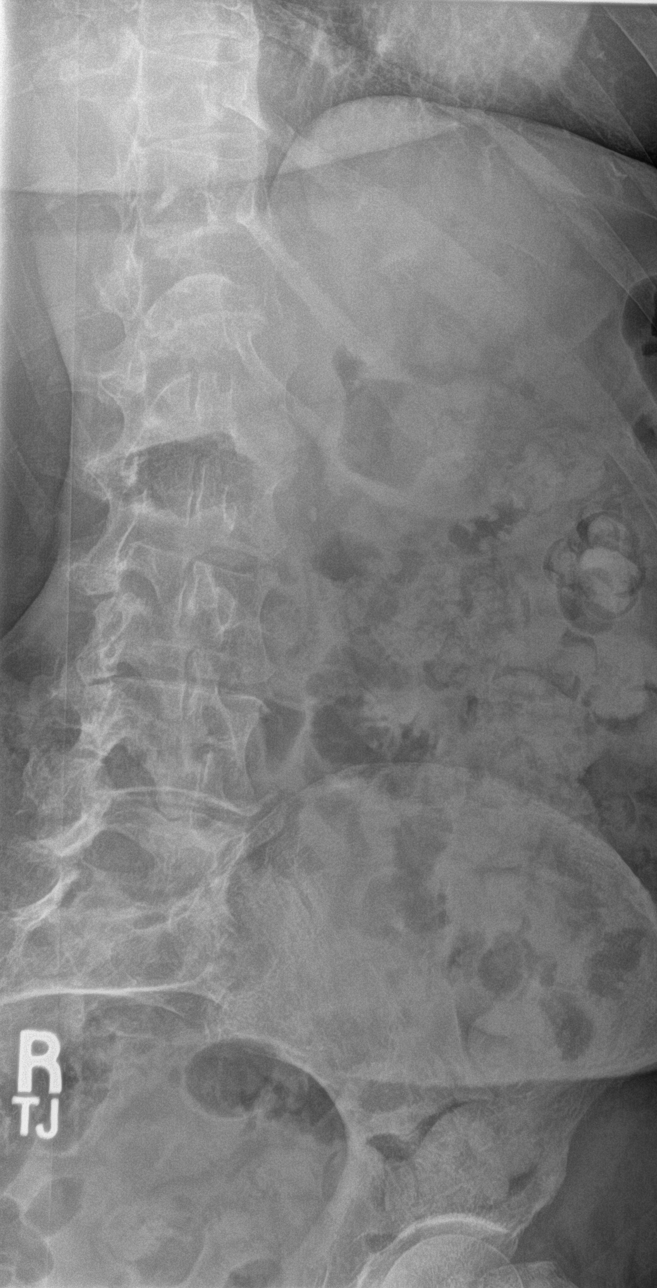

[l-spine lat]
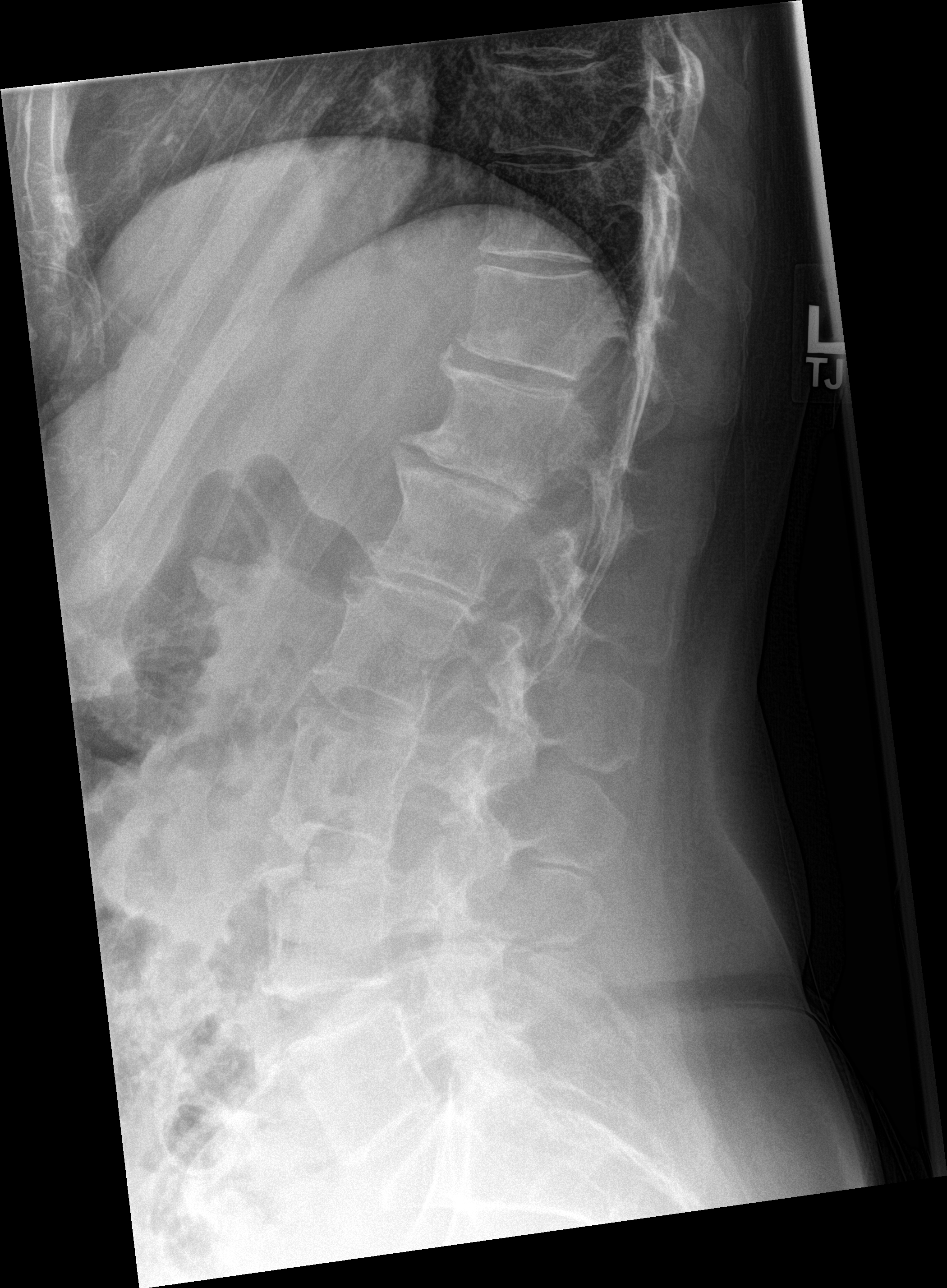

[l-spine l5/s1]
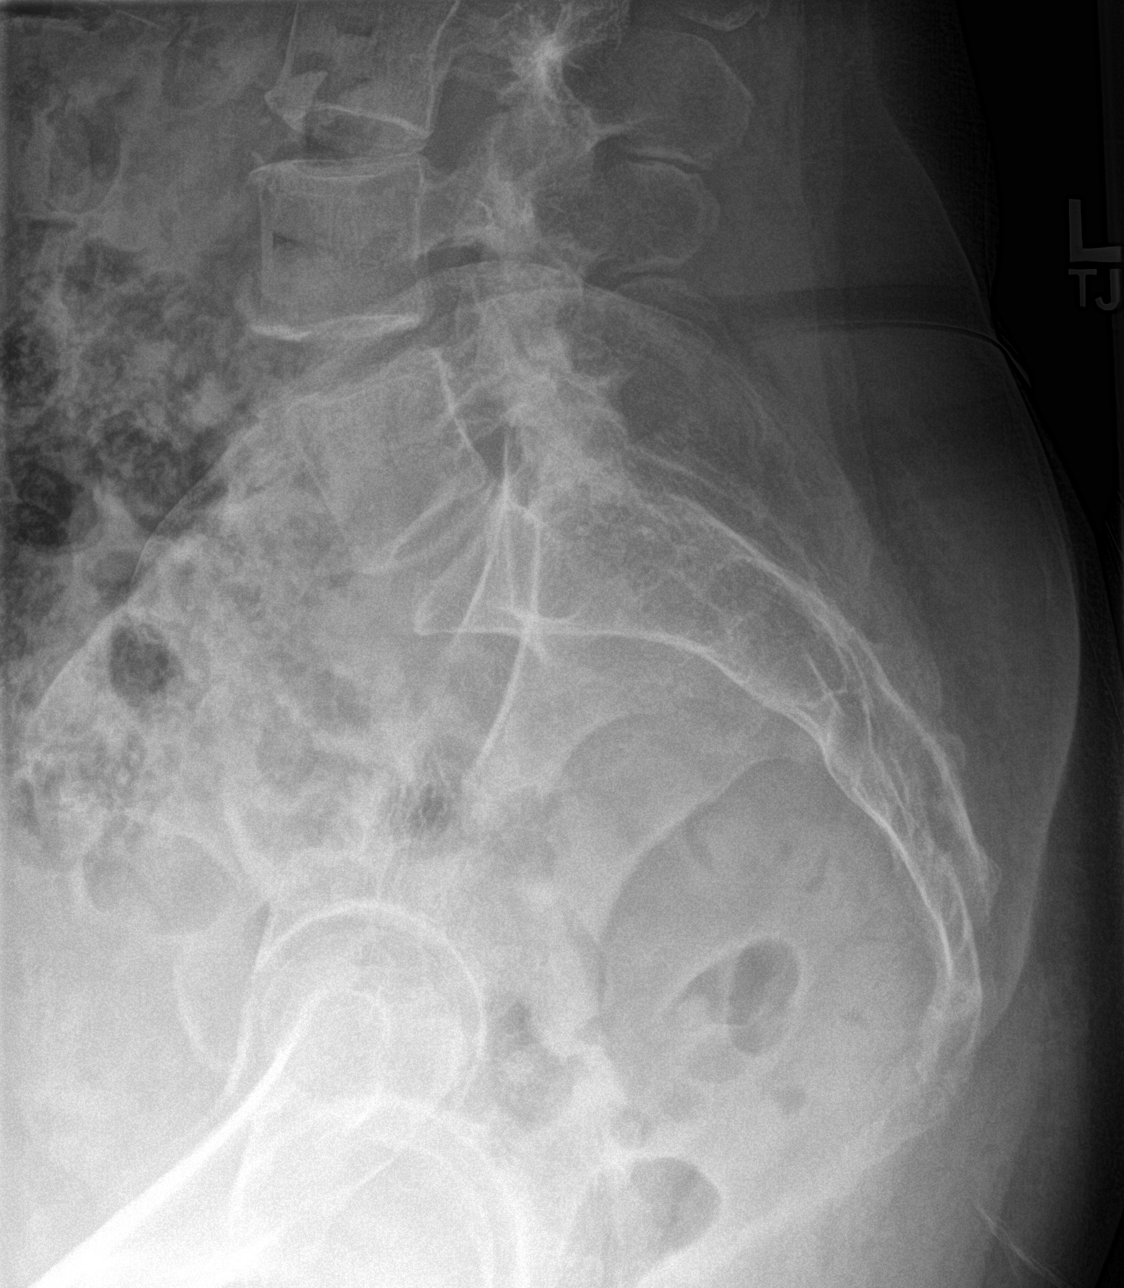

[5 of 5 positions shown; findings below may reference images not displayed]

FINDINGS: There is no fracture or bone destruction. There is moderate
degenerative disc disease at T12-L1 with disc space narrowing. Less
prominent disc degeneration at L1-2. The other discs in the lumbar
spine are normal. Slight degenerative facet arthritis at L4-5
bilaterally.
IMPRESSION: Degenerative disc disease at T12-L1 and L1-2. Slight degenerative
facet arthritis at L4-5.

## 2020-06-04 DIAGNOSIS — U071 COVID-19: Secondary | ICD-10-CM

## 2020-06-04 HISTORY — DX: COVID-19: U07.1

## 2020-06-26 ENCOUNTER — Encounter: Payer: Self-pay | Admitting: Emergency Medicine

## 2020-06-26 ENCOUNTER — Ambulatory Visit (INDEPENDENT_AMBULATORY_CARE_PROVIDER_SITE_OTHER): Payer: BC Managed Care – PPO

## 2020-06-26 ENCOUNTER — Other Ambulatory Visit: Payer: Self-pay

## 2020-06-26 ENCOUNTER — Ambulatory Visit: Payer: Self-pay

## 2020-06-26 ENCOUNTER — Ambulatory Visit
Admission: EM | Admit: 2020-06-26 | Discharge: 2020-06-26 | Disposition: A | Discharge status: Home / Self Care | Payer: BC Managed Care – PPO | Attending: Family Medicine | Admitting: Family Medicine

## 2020-06-26 DIAGNOSIS — R509 Fever, unspecified: Secondary | ICD-10-CM | POA: Diagnosis not present

## 2020-06-26 DIAGNOSIS — B349 Viral infection, unspecified: Secondary | ICD-10-CM | POA: Diagnosis not present

## 2020-06-26 DIAGNOSIS — R059 Cough, unspecified: Secondary | ICD-10-CM

## 2020-06-26 LAB — POCT RAPID STREP A (OFFICE): Rapid Strep A Screen: NEGATIVE

## 2020-06-26 MED ORDER — ACETAMINOPHEN 325 MG PO TABS
650.0000 mg | ORAL_TABLET | Freq: Once | ORAL | Status: AC
  Administered 2020-06-26: 650 mg via ORAL

## 2020-06-26 NOTE — ED Triage Notes (Addendum)
Patient c/o sore throat, fever, nausea, non-productive cough, and body aches x 3 days.   Patient endorses difficulty swallowing.   Patient endorses fever of 102F.   Patient has used Aspirin w/ no relief.

## 2020-06-26 NOTE — Discharge Instructions (Addendum)
Strep test was negative today.  Your x-ray was normal Concern for flu versus Covid. I will prescribe Zofran for nausea, vomiting as needed.  You can continue with Tylenol, ibuprofen for fevers, pain Cough and cold medication as needed Follow up as needed for continued or worsening symptoms

## 2020-06-26 NOTE — ED Provider Notes (Signed)
Kristen Soto    CSN: [id number] Arrival date & time: 06/26/20  1355      History   Chief Complaint Chief Complaint  Patient presents with  . Sore Throat  . Cough    HPI Kristen Soto is a 64 y.o. female.   Patient is a 64 year old female who presents today with complaints of sore throat, fever, nausea, vomiting, nonproductive cough, body aches for 3 days.  Pain with swallowing.  Loss of appetite.  Fever of 102 at home.  Has been taking aspirin with no relief.  She is also had pretty bad headache.  Was exposed to strep.     Past Medical History:  Diagnosis Date  . Cancer ([place])    breast  . 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   . Hypertension   . Osteoporosis     Patient Active Problem List   Diagnosis Date Noted  . Hyperlipidemia 09/30/2019  . Lumbar back pain 09/25/2019  . 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 Multiple thyroid nodules 07/02/2019  . Chest discomfort 07/02/2019  . Osteoporosis 07/29/2015  . Health maintenance examination 06/17/2014  . Recurrent UTI 06/17/2014  . ALLERGIC RHINITIS 10/11/2008  . Essential hypertension 09/06/2008  . GENITAL HERPES 08/02/2008  . Vitamin D deficiency 08/02/2008  . BREAST CANCER, HX OF 06/19/2008    Past Surgical History:  Procedure Laterality Date  . BASAL CELL CARCINOMA EXCISION  05/2008   removal, left eyelid  . CYSTOSCOPY  02/02/2000   mild urethral trigonitis  . ELBOW SURGERY Right 2019    OB History   No obstetric history on file.      Home Medications    Prior to Admission medications   Medication Sig Start Date End Date Taking? Authorizing Provider  amLODipine (NORVASC) 5 MG tablet TAKE 1 TABLET DAILY 03/03/20   Ria Bush, MD  Calcium Carb-Cholecalciferol (CALCIUM-VITAMIN D) 600-400 MG-UNIT TABS Take 1  tablet by mouth daily. 07/02/19   Ria Bush, MD  ibandronate (BONIVA) 150 MG tablet Take 150 mg by mouth every 30 (thirty) days. Take in the morning with a full glass of water, on an empty stomach, and do not take anything else by mouth or lie down for the next 30 min.    [provider]  lisinopril (ZESTRIL) 40 MG tablet Take 1 tablet (40 mg total) by mouth daily. 03/03/20   Ria Bush, MD  Multiple Vitamin (MULTIVITAMIN) tablet Take 1 tablet by mouth daily.    [provider]    Family History Family History  Problem Relation Age of Onset  . Alcohol abuse Mother   . Hypertension Mother   . Graves' disease Mother   . Heart attack Father 76  . Hypertension Father   . Hypertension Sister   . Kidney disease Paternal Grandmother        dialysis related  . Hypertension Paternal Grandmother   . Heart attack Paternal Grandfather 32  . Cancer Sister 15       breast  . Cancer Maternal Grandmother 83       breast  . Heart disease Maternal Grandfather        pacemaker    Social History Social History   Tobacco Use  . Smoking status: Former Research scientist (life sciences)  . Smokeless tobacco: Never Used  Vaping Use  . Vaping Use:  Never used  Substance Use Topics  . Alcohol use: Yes    Alcohol/week: 0.0 standard drinks    Comment: wine-occ  . Drug use: No     Allergies   Diovan [valsartan] and Hctz [hydrochlorothiazide]   Review of Systems Review of Systems   Physical Exam Triage Vital Signs ED Triage Vitals  Enc Vitals Group     BP 06/26/20 1419 (!) 146/87     Pulse Rate 06/26/20 1419 95     Resp 06/26/20 1419 20     Temp 06/26/20 1419 (!) 102.6 F (39.2 C)     Temp Source 06/26/20 1419 Oral     SpO2 06/26/20 1419 92 %     Weight 06/26/20 1416 135 lb (61.2 kg)     Height 06/26/20 1416 5\' 2"  (1.575 m)     Head Circumference --      Peak Flow --      Pain Score 06/26/20 1416 8     Pain Loc --      Pain Edu? --      Excl. in [place]? --    No data  found.  Updated Vital Signs BP (!) 146/87 (BP Location: Right Arm)   Pulse 95   Temp (!) 102.6 F (39.2 C) (Oral)   Resp 20   Ht 5\' 2"  (1.575 m)   Wt 135 lb (61.2 kg)   LMP 10/29/2010   SpO2 92%   BMI 24.69 kg/m   Visual Acuity Right Eye Distance:   Left Eye Distance:   Bilateral Distance:    Right Eye Near:   Left Eye Near:    Bilateral Near:     Physical Exam Vitals and nursing note reviewed.  Constitutional:      General: She is not in acute distress.    Appearance: Normal appearance. She is ill-appearing. She is not toxic-appearing or diaphoretic.  HENT:     Head: Normocephalic.     Right Ear: Tympanic membrane and ear canal normal.     Left Ear: Tympanic membrane and ear canal normal.     Nose: Nose normal.     Mouth/Throat:     Pharynx: Oropharynx is clear. Posterior oropharyngeal erythema present.  Eyes:     Conjunctiva/sclera: Conjunctivae normal.  Cardiovascular:     Rate and Rhythm: Normal rate.  Pulmonary:     Effort: Pulmonary effort is normal.     Breath sounds: Rhonchi present.  Musculoskeletal:        General: Normal range of motion.     Cervical back: Normal range of motion.  Skin:    General: Skin is warm and dry.     Findings: No rash.  Neurological:     Mental Status: She is alert.  Psychiatric:        Mood and Affect: Mood normal.      UC Treatments / Results  Labs (all labs ordered are listed, but only abnormal results are displayed) Labs Reviewed  COVID-19, FLU A+B AND RSV  CULTURE, GROUP A STREP [facility])  POCT RAPID STREP A (OFFICE)    EKG   Radiology DG Chest 2 View  Result Date: 06/26/2020 CLINICAL DATA:  Cough, fever, low sats. EXAM: CHEST - 2 VIEW COMPARISON:  None. FINDINGS: The heart size and mediastinal contours are within normal limits. No consolidation. Mild hyperinflation. No visible pleural effusions or pneumothorax. Biapical pleuroparenchymal scarring. No acute osseous abnormality. Clips project in the left  hilar region. IMPRESSION: 1. No acute cardiopulmonary disease. 2.  Mild hyperinflation, suggestive of COPD/emphysema. Electronically Signed   By: Margaretha Sheffield MD   On: 06/26/2020 14:58    Procedures Procedures (including critical care time)  Medications Ordered in UC Medications  acetaminophen (TYLENOL) tablet 650 mg (650 mg Oral Given 06/26/20 1431)    Initial Impression / Assessment and Plan / UC Course  I have reviewed the triage vital signs and the nursing notes.  Pertinent labs & imaging results that were available during my care of the patient were reviewed by me and considered in my medical decision making (see chart for details).    Viral illness Concern for flu versus Covid. Chest x-ray without any acute findings. Strep test negative. Covid and flu swab pending.  Recommend continue the over-the-counter medicines for symptoms as needed.  Tylenol and ibuprofen for pain or fevers. Zofran for nausea as needed. Follow up as needed for continued or worsening symptoms  Final Clinical Impressions(s) / UC Diagnoses   Final diagnoses:  Viral illness     Discharge Instructions     Strep test was negative today.  Your x-ray was normal Concern for flu versus Covid. I will prescribe Zofran for nausea, vomiting as needed.  You can continue with Tylenol, ibuprofen for fevers, pain Cough and cold medication as needed Follow up as needed for continued or worsening symptoms     ED Prescriptions    None     PDMP not reviewed this encounter.   Orvan July, NP 06/26/20 1502

## 2020-06-28 LAB — COVID-19, FLU A+B AND RSV
Influenza A, NAA: NOT DETECTED
Influenza B, NAA: NOT DETECTED
RSV, NAA: NOT DETECTED
SARS-CoV-2, NAA: DETECTED — AB

## 2020-06-29 LAB — CULTURE, GROUP A STREP (THRC)

## 2020-06-30 ENCOUNTER — Encounter: Payer: Self-pay | Admitting: Family Medicine

## 2020-06-30 NOTE — Telephone Encounter (Signed)
I called in pt's info to mAb infusion hotline.  If she's interested in treatment, I recommend she stay in town until gets mAb locally.  plz call tomorrow for update on symptoms.

## 2020-07-01 ENCOUNTER — Encounter: Payer: Self-pay | Admitting: Family Medicine

## 2020-07-01 ENCOUNTER — Encounter: Payer: Self-pay | Admitting: Infectious Diseases

## 2020-07-01 NOTE — Progress Notes (Signed)
Referral received for monoclonal Ab treatment for acute COVID-19 infection.  Chart review completed - patient is beyond 7 days of symptoms at the time of review. Based on national supply shortages we are not able to offer treatment beyond this time frame due to lack of benefit for overall risk reduction beyond that time frame.  Patient also appears uninterested based on mychart message.    Rexene Alberts, MSN, NP-C [facility]  [email or web address]

## 2020-07-01 NOTE — Telephone Encounter (Signed)
Please call again Thursday for update on symptoms.

## 2020-07-01 NOTE — Telephone Encounter (Addendum)
Returning phone call , cell phone [phone]

## 2020-07-01 NOTE — Telephone Encounter (Addendum)
Lvm asking pt to call back.  Need an update on sxs and relay Dr. Timoteo Expose message.

## 2020-07-02 ENCOUNTER — Encounter: Payer: Self-pay | Admitting: Family Medicine

## 2020-07-02 NOTE — Telephone Encounter (Signed)
Spoke with pt asking for an update.  States she is feeling better so she declined infusion.  Just still has mild lingering cough and fatigue.

## 2020-07-02 NOTE — Telephone Encounter (Signed)
Noted. Thanks.

## 2020-07-02 NOTE — Telephone Encounter (Signed)
Noted  

## 2020-09-18 ENCOUNTER — Other Ambulatory Visit: Payer: Self-pay | Admitting: Family Medicine

## 2020-09-18 DIAGNOSIS — E559 Vitamin D deficiency, unspecified: Secondary | ICD-10-CM

## 2020-09-18 DIAGNOSIS — E042 Nontoxic multinodular goiter: Secondary | ICD-10-CM

## 2020-09-18 DIAGNOSIS — E785 Hyperlipidemia, unspecified: Secondary | ICD-10-CM

## 2020-09-19 ENCOUNTER — Other Ambulatory Visit (INDEPENDENT_AMBULATORY_CARE_PROVIDER_SITE_OTHER): Payer: BC Managed Care – PPO

## 2020-09-19 ENCOUNTER — Other Ambulatory Visit: Payer: Self-pay

## 2020-09-19 DIAGNOSIS — E785 Hyperlipidemia, unspecified: Secondary | ICD-10-CM | POA: Diagnosis not present

## 2020-09-19 DIAGNOSIS — E559 Vitamin D deficiency, unspecified: Secondary | ICD-10-CM | POA: Diagnosis not present

## 2020-09-19 DIAGNOSIS — E042 Nontoxic multinodular goiter: Secondary | ICD-10-CM

## 2020-09-19 LAB — COMPREHENSIVE METABOLIC PANEL
ALT: 21 U/L (ref 0–35)
AST: 21 U/L (ref 0–37)
Albumin: 4.4 g/dL (ref 3.5–5.2)
Alkaline Phosphatase: 64 U/L (ref 39–117)
BUN: 16 mg/dL (ref 6–23)
CO2: 29 mEq/L (ref 19–32)
Calcium: 9.4 mg/dL (ref 8.4–10.5)
Chloride: 105 mEq/L (ref 96–112)
Creatinine, Ser: 0.72 mg/dL (ref 0.40–1.20)
GFR: 88.67 mL/min (ref >60.00)
Glucose, Bld: 84 mg/dL (ref 70–99)
Potassium: 3.9 mEq/L (ref 3.5–5.1)
Sodium: 141 mEq/L (ref 135–145)
Total Bilirubin: 0.8 mg/dL (ref 0.2–1.2)
Total Protein: 6.8 g/dL (ref 6.0–8.3)

## 2020-09-19 LAB — VITAMIN D 25 HYDROXY (VIT D DEFICIENCY, FRACTURES): VITD: 47.2 ng/mL (ref 30.00–100.00)

## 2020-09-19 LAB — LIPID PANEL
Cholesterol: 224 mg/dL — ABNORMAL HIGH (ref 0–200)
HDL: 66.3 mg/dL (ref >39.00)
LDL Cholesterol: 137 mg/dL — ABNORMAL HIGH (ref 0–99)
NonHDL: 158.11
Total CHOL/HDL Ratio: 3
Triglycerides: 106 mg/dL (ref 0.0–149.0)
VLDL: 21.2 mg/dL (ref 0.0–40.0)

## 2020-09-19 LAB — TSH: TSH: 1.4 u[IU]/mL (ref 0.35–4.50)

## 2020-09-26 ENCOUNTER — Encounter: Payer: Self-pay | Admitting: Family Medicine

## 2020-09-26 ENCOUNTER — Ambulatory Visit (INDEPENDENT_AMBULATORY_CARE_PROVIDER_SITE_OTHER): Payer: BC Managed Care – PPO | Admitting: Family Medicine

## 2020-09-26 ENCOUNTER — Other Ambulatory Visit: Payer: Self-pay

## 2020-09-26 VITALS — BP 136/76 | HR 73 | Temp 97.9°F | Ht 61.0 in | Wt 136.6 lb

## 2020-09-26 DIAGNOSIS — Z853 Personal history of malignant neoplasm of breast: Secondary | ICD-10-CM

## 2020-09-26 DIAGNOSIS — Z1211 Encounter for screening for malignant neoplasm of colon: Secondary | ICD-10-CM | POA: Diagnosis not present

## 2020-09-26 DIAGNOSIS — R109 Unspecified abdominal pain: Secondary | ICD-10-CM | POA: Diagnosis not present

## 2020-09-26 DIAGNOSIS — E785 Hyperlipidemia, unspecified: Secondary | ICD-10-CM | POA: Diagnosis not present

## 2020-09-26 DIAGNOSIS — H9313 Tinnitus, bilateral: Secondary | ICD-10-CM

## 2020-09-26 DIAGNOSIS — M810 Age-related osteoporosis without current pathological fracture: Secondary | ICD-10-CM

## 2020-09-26 DIAGNOSIS — Z7189 Other specified counseling: Secondary | ICD-10-CM

## 2020-09-26 DIAGNOSIS — Z01419 Encounter for gynecological examination (general) (routine) without abnormal findings: Secondary | ICD-10-CM

## 2020-09-26 DIAGNOSIS — Z0000 Encounter for general adult medical examination without abnormal findings: Secondary | ICD-10-CM | POA: Diagnosis not present

## 2020-09-26 DIAGNOSIS — E559 Vitamin D deficiency, unspecified: Secondary | ICD-10-CM

## 2020-09-26 DIAGNOSIS — I10 Essential (primary) hypertension: Secondary | ICD-10-CM

## 2020-09-26 DIAGNOSIS — E042 Nontoxic multinodular goiter: Secondary | ICD-10-CM

## 2020-09-26 DIAGNOSIS — Z9189 Other specified personal risk factors, not elsewhere classified: Secondary | ICD-10-CM

## 2020-09-26 LAB — POC URINALSYSI DIPSTICK (AUTOMATED)
Bilirubin, UA: NEGATIVE
Blood, UA: NEGATIVE
Glucose, UA: NEGATIVE
Ketones, UA: NEGATIVE
Leukocytes, UA: NEGATIVE
Nitrite, UA: NEGATIVE
Protein, UA: NEGATIVE
Spec Grav, UA: 1.025 (ref 1.010–1.025)
Urobilinogen, UA: 0.2 E.U./dL
pH, UA: 6 (ref 5.0–8.0)

## 2020-09-26 MED ORDER — AMLODIPINE BESYLATE 5 MG PO TABS: ORAL_TABLET | ORAL | 3 refills | Status: DC

## 2020-09-26 MED ORDER — LISINOPRIL 40 MG PO TABS: 40.0000 mg | ORAL_TABLET | Freq: Every day | ORAL | 3 refills | Status: DC

## 2020-09-26 MED ORDER — IBANDRONATE SODIUM 150 MG PO TABS: 150.0000 mg | ORAL_TABLET | ORAL | 3 refills | Status: DC

## 2020-09-26 NOTE — Progress Notes (Signed)
Patient ID: Kristen Soto, female    DOB: [birth year], 65 y.o.   MRN: [id number]  This visit was conducted in person.  BP 136/76   Pulse 73   Temp 97.9 F (36.6 C) (Temporal)   Ht 5' 1" (1.549 m)   Wt 136 lb 9 oz (61.9 kg)   LMP 10/29/2010   SpO2 98%   BMI 25.80 kg/m    CC: CPE Subjective:   HPI: Kristen Soto is a 65 y.o. female presenting on 09/26/2020 for Annual Exam (Wants to discuss Boniva.)   Did get COVID around Christmas, symptoms lasted for 3 wks. Symptoms fully resolved. Declines vaccine.   Requests 3D mammo to be completed at [facility] as well as updated DEXA scan at [place].   Notes worsening tinnitus, more noticeable at night, ongoing for years.   Ongoing R flank/lower back pain present for 1 + year. Worse when getting up out of bed. Worse with activity. She had recent UTIs - [facility] treated with macrobid with benefit. Had microhematuria once.   Preventative: Colon cancer screening - normal colonoscopy at [facility] (I can't see report) 12/2007. Requests yearly stool kits since. Last normal 09/2019. Rpt today.  H/o breast cancer ~2006 s/p remote chemotherapy as well as tamoxifen use - gets yearly mammograms at [facility], latest mammo 01/2019 [facility]. Needs order yearly - would like 3D screening mammogram at [facility]).  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 Guidelines recommend yearly pelvic exam/pap smear of cervix and vagina and visualization of entire vagina due to increased risk of vaginal or cervical clear cell adenocarcinoma.She declines - always normal in the past. Does agree to GYN referral.  Lung cancer screening - not eligible  DEXA scan 2018 - h/o osteoporosis managed on monthly boniva (sometimes forgets) and regular calcium, vit D, weight bearing exercise. Previously on prolia but became too expensive.  Flu shot - declines  COVID vaccine - discussed, declines  Tdap 2014  Pneumonia shot - not due yet   Shingrix - completed (2018 2019) Advanced directive discussion - brings advanced directive - scanned 09/2020. Husband [name] and son [name] are HCPOA. Does not want prolonged life support if terminal condition. Does want artificial hydration/nutrition continued. HCPOA may override advanced directive.  Seat belt use discussed Sunscreen use discussed. No changing moles on skin. Sees derm regularly.  Ex smoker, quit 30 yrs ago.  Alcohol - a few glasses wine per week  Dentist - q6 mo Eye exam - yearly  Lives with husband Kristen Soto: retired 2015, was Nature conservation officer for [employer] Act: no regular exercise Diet: good water, fruits/vegetables daily     Relevant past medical, surgical, family and social history reviewed and updated as indicated. Interim medical history since our last visit reviewed. Allergies and medications reviewed and updated. Outpatient Medications Prior to Visit  Medication Sig Dispense Refill  . Calcium Carb-Cholecalciferol (CALCIUM-VITAMIN D) 600-400 MG-UNIT TABS Take 1 tablet by mouth daily. 60 tablet   . Multiple Vitamin (MULTIVITAMIN) tablet Take 1 tablet by mouth daily.    . Omega-3 Fatty Acids (FISH OIL PO) Take by mouth daily.    Marland Kitchen amLODipine (NORVASC) 5 MG tablet TAKE 1 TABLET DAILY 90 tablet 2  . ibandronate (BONIVA) 150 MG tablet Take 150 mg by mouth every 30 (thirty) days. Take in the morning with a full glass of water, on an empty stomach, and do not take anything else by mouth  or lie down for the next 30 min.    . lisinopril (ZESTRIL) 40 MG tablet Take 1 tablet (40 mg total) by mouth daily. 90 tablet 2   No facility-administered medications prior to visit.     Per HPI unless specifically indicated in ROS section below Review of Systems  Constitutional: Negative for activity change, appetite change, chills, fatigue, fever and unexpected weight change.  HENT: Negative for hearing loss.   Eyes: Negative for visual disturbance.  Respiratory: Negative for  cough, chest tightness, shortness of breath and wheezing.   Cardiovascular: Negative for chest pain, palpitations and leg swelling.  Gastrointestinal: Negative for abdominal distention, abdominal pain, blood in stool, constipation, diarrhea, nausea and vomiting.  Genitourinary: Negative for difficulty urinating and hematuria.  Musculoskeletal: Negative for arthralgias, myalgias and neck pain.  Skin: Negative for rash.  Neurological: Negative for dizziness, seizures, syncope and headaches.  Hematological: Negative for adenopathy. Does not bruise/bleed easily.  Psychiatric/Behavioral: Negative for dysphoric mood. The patient is not nervous/anxious.    Objective:  BP 136/76   Pulse 73   Temp 97.9 F (36.6 C) (Temporal)   Ht 5' 1" (1.549 m)   Wt 136 lb 9 oz (61.9 kg)   LMP 10/29/2010   SpO2 98%   BMI 25.80 kg/m   Wt Readings from Last 3 Encounters:  09/26/20 136 lb 9 oz (61.9 kg)  06/26/20 135 lb (61.2 kg)  09/24/19 135 lb 6 oz (61.4 kg)      Physical Exam Vitals and nursing note reviewed.  Constitutional:      General: She is not in acute distress.    Appearance: Normal appearance. She is well-developed. She is not ill-appearing.  HENT:     Head: Normocephalic and atraumatic.     Right Ear: Hearing, tympanic membrane, ear canal and external ear normal.     Left Ear: Hearing, tympanic membrane, ear canal and external ear normal.  Eyes:     General: No scleral icterus.    Extraocular Movements: Extraocular movements intact.     Conjunctiva/sclera: Conjunctivae normal.     Pupils: Pupils are equal, round, and reactive to light.  Neck:     Thyroid: No thyroid mass or thyromegaly.     Vascular: No carotid bruit.  Cardiovascular:     Rate and Rhythm: Normal rate and regular rhythm.     Pulses: Normal pulses.          Radial pulses are 2+ on the right side and 2+ on the left side.     Heart sounds: Normal heart sounds. No murmur heard.   Pulmonary:     Effort: Pulmonary  effort is normal. No respiratory distress.     Breath sounds: Normal breath sounds. No wheezing, rhonchi or rales.  Abdominal:     General: Abdomen is flat. Bowel sounds are normal. There is no distension.     Palpations: Abdomen is soft. There is no mass.     Tenderness: There is no abdominal tenderness. There is no guarding or rebound.     Hernia: No hernia is present.  Musculoskeletal:        General: Normal range of motion.     Cervical back: Normal range of motion and neck supple.     Right lower leg: No edema.     Left lower leg: No edema.  Lymphadenopathy:     Cervical: No cervical adenopathy.  Skin:    General: Skin is warm and dry.     Findings: No   rash.  Neurological:     General: No focal deficit present.     Mental Status: She is alert and oriented to person, place, and time.     Comments: CN grossly intact, station and gait intact  Psychiatric:        Mood and Affect: Mood normal.        Behavior: Behavior normal.        Thought Content: Thought content normal.        Judgment: Judgment normal.       Results for orders placed or performed in visit on 09/26/20  POCT Urinalysis Dipstick (Automated)  Result Value Ref Range   Color, UA yellow    Clarity, UA clear    Glucose, UA Negative Negative   Bilirubin, UA negative    Ketones, UA negative    Spec Grav, UA 1.025 1.010 - 1.025   Blood, UA negative    pH, UA 6.0 5.0 - 8.0   Protein, UA Negative Negative   Urobilinogen, UA 0.2 0.2 or 1.0 E.U./dL   Nitrite, UA negative    Leukocytes, UA Negative Negative   Assessment & Plan:  This visit occurred during the SARS-CoV-2 public health emergency.  Safety protocols were in place, including screening questions prior to the visit, additional usage of staff PPE, and extensive cleaning of exam room while observing appropriate contact time as indicated for disinfecting solutions.   Problem List Items Addressed This Visit    Vitamin D deficiency    Chronic stable on  current replacement (cal+vit D)      Essential hypertension    Chronic, stable. Continue lisinopril 40mg daily + amlodipine 5mg daily.       Relevant Medications   amLODipine (NORVASC) 5 MG tablet   lisinopril (ZESTRIL) 40 MG tablet   BREAST CANCER, HX OF    Gets yearly 3D mammograms at [facility] in [place].       Health maintenance examination - Primary    Preventative protocols reviewed and updated unless pt declined. Discussed healthy diet and lifestyle.       Osteoporosis    Continues monthly boniva, sometimes forgets to take.  Discussed evista - rec continue boniva at this time.  Prolia unaffordable.  Due for updated DEXA - will order.       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 Would also like recommendations for when to discontinue cervical cancer screening.       Relevant Orders   Ambulatory referral to Gynecology   Multiple thyroid nodules    H/o this, has declined recheck in the past.       Hyperlipidemia    Chronic off meds. Reviewed ASCVD risk.  The 10-year ASCVD risk score (Goff DC Jr., et al., 2013) is: 6.6%   Values used to calculate the score:     Age: 64 years     Sex: Female     Is Non-Hispanic African American: No     Diabetic: No     Tobacco smoker: No     Systolic Blood Pressure: 136 mmHg     Is BP treated: Yes     HDL Cholesterol: 66.3 mg/dL     Total Cholesterol: 224 mg/dL       Relevant Medications   amLODipine (NORVASC)   5 MG tablet   lisinopril (ZESTRIL) 40 MG tablet   Right flank pain    Ongoing for over a year, with positional component suggesting MSK cause. Check UA - normal. Check renal US.       Relevant Orders   POCT Urinalysis Dipstick (Automated) (Completed)   US RENAL   Tinnitus    Bilateral, present for >1 yr. Discussed possible med  relation - she is on ACEI. Not bothersome to the point of wanting to change medication at this time. Will watch for now.       Advanced directives, counseling/discussion    Advanced directive discussion - brings advanced directive - scanned 09/2020. Husband David Ray and son David Natale are HCPOA. Does not want prolonged life support if terminal condition. Does want artificial hydration/nutrition continued. HCPOA may override advanced directive.        Other Visit Diagnoses    Special screening for malignant neoplasms, colon       Relevant Orders   Fecal occult blood, imunochemical   Well woman exam with routine gynecological exam       Relevant Orders   Ambulatory referral to Gynecology       Meds ordered this encounter  Medications  . ibandronate (BONIVA) 150 MG tablet    Sig: Take 1 tablet (150 mg total) by mouth every 30 (thirty) days. Take in the morning with a full glass of water, on an empty stomach, and do not take anything else by mouth or lie down for the next 30 min.    Dispense:  3 tablet    Refill:  3  . amLODipine (NORVASC) 5 MG tablet    Sig: TAKE 1 TABLET DAILY    Dispense:  90 tablet    Refill:  3  . lisinopril (ZESTRIL) 40 MG tablet    Sig: Take 1 tablet (40 mg total) by mouth daily.    Dispense:  90 tablet    Refill:  3   Orders Placed This Encounter  Procedures  . Fecal occult blood, imunochemical    Standing Status:   Future    Standing Expiration Date:   09/27/2021  . DG Bone Density    Standing Status:   Future    Standing Expiration Date:   09/27/2021    Scheduling Instructions:     Requests at [facility] in [place]    Order Specific Question:   Reason for Exam (SYMPTOM  OR DIAGNOSIS REQUIRED)    Answer:   osteoporosis f/u    Order Specific Question:   Preferred imaging location?    Answer:   [facility]  . US RENAL    Standing Status:   Future    Standing Expiration Date:   09/27/2021    Order Specific Question:   Reason for Exam (SYMPTOM  OR  DIAGNOSIS REQUIRED)    Answer:   chronic R flank pain    Order Specific Question:   Preferred imaging location?    Answer:   [facility]  . Ambulatory referral to Gynecology    Referral Priority:   Routine    Referral Type:   Consultation    Referral Reason:   Specialty Services Required    Requested Specialty:   Gynecology    Number of Visits Requested:   1  . POCT Urinalysis Dipstick (Automated)    Patient instructions: Urinalysis today - we will also check kidney ultrasound for ongoing right flank pain.  Pass by lab to pick up stool kit.    We will refer you to gynecologist.  We will order 3d mammo and bone density scan.  Return 10/2021 for welcome to medicare visit.   Follow up plan: Return in about 13 months (around 10/27/2021), or if symptoms worsen or fail to improve, for annual exam, prior fasting for blood work.  Javier Gutierrez, MD   

## 2020-09-26 NOTE — Assessment & Plan Note (Signed)
Preventative protocols reviewed and updated unless pt declined. Discussed healthy diet and lifestyle.  

## 2020-09-26 NOTE — Patient Instructions (Addendum)
Urinalysis today - we will also check kidney ultrasound for ongoing right flank pain.  Pass by lab to pick up stool kit.  We will refer you to gynecologist.  We will order 3d mammo and bone density scan.  Return 10/2021 for welcome to medicare visit.   Health Maintenance for Postmenopausal Women Menopause is a normal process in which your ability to get pregnant comes to an end. This process happens slowly over many months or years, usually between the ages of 47 and 31. Menopause is complete when you have missed your menstrual periods for 12 months. It is important to talk with your health care provider about some of the most common conditions that affect women after menopause (postmenopausal women). These include heart disease, cancer, and bone loss (osteoporosis). Adopting a healthy lifestyle and getting preventive care can help to promote your health and wellness. The actions you take can also lower your chances of developing some of these common conditions. What should I know about menopause? During menopause, you may get a number of symptoms, such as:  Hot flashes. These can be moderate or severe.  Night sweats.  Decrease in sex drive.  Mood swings.  Headaches.  Tiredness.  Irritability.  Memory problems.  Insomnia. Choosing to treat or not to treat these symptoms is a decision that you make with your health care provider. Do I need hormone replacement therapy?  Hormone replacement therapy is effective in treating symptoms that are caused by menopause, such as hot flashes and night sweats.  Hormone replacement carries certain risks, especially as you become older. If you are thinking about using estrogen or estrogen with progestin, discuss the benefits and risks with your health care provider. What is my risk for heart disease and stroke? The risk of heart disease, heart attack, and stroke increases as you age. One of the causes may be a change in the body's hormones during  menopause. This can affect how your body uses dietary fats, triglycerides, and cholesterol. Heart attack and stroke are medical emergencies. There are many things that you can do to help prevent heart disease and stroke. Watch your blood pressure  High blood pressure causes heart disease and increases the risk of stroke. This is more likely to develop in people who have high blood pressure readings, are of African descent, or are overweight.  Have your blood pressure checked: ? Every 3-5 years if you are 45-36 years of age. ? Every year if you are 1 years old or older. Eat a healthy diet  Eat a diet that includes plenty of vegetables, fruits, low-fat dairy products, and lean protein.  Do not eat a lot of foods that are high in solid fats, added sugars, or sodium.   Get regular exercise Get regular exercise. This is one of the most important things you can do for your health. Most adults should:  Try to exercise for at least 150 minutes each week. The exercise should increase your heart rate and make you sweat (moderate-intensity exercise).  Try to do strengthening exercises at least twice each week. Do these in addition to the moderate-intensity exercise.  Spend less time sitting. Even light physical activity can be beneficial. Other tips  Work with your health care provider to achieve or maintain a healthy weight.  Do not use any products that contain nicotine or tobacco, such as cigarettes, e-cigarettes, and chewing tobacco. If you need help quitting, ask your health care provider.  Know your numbers. Ask your health care  provider to check your cholesterol and your blood sugar (glucose). Continue to have your blood tested as directed by your health care provider. Do I need screening for cancer? Depending on your health history and family history, you may need to have cancer screening at different stages of your life. This may include screening for:  Breast cancer.  Cervical  cancer.  Lung cancer.  Colorectal cancer. What is my risk for osteoporosis? After menopause, you may be at increased risk for osteoporosis. Osteoporosis is a condition in which bone destruction happens more quickly than new bone creation. To help prevent osteoporosis or the bone fractures that can happen because of osteoporosis, you may take the following actions:  If you are 86-44 years old, get at least 1,000 mg of calcium and at least 600 mg of vitamin D per day.  If you are older than age 1 but younger than age 15, get at least 1,200 mg of calcium and at least 600 mg of vitamin D per day.  If you are older than age 28, get at least 1,200 mg of calcium and at least 800 mg of vitamin D per day. Smoking and drinking excessive alcohol increase the risk of osteoporosis. Eat foods that are rich in calcium and vitamin D, and do weight-bearing exercises several times each week as directed by your health care provider. How does menopause affect my mental health? Depression may occur at any age, but it is more common as you become older. Common symptoms of depression include:  Low or sad mood.  Changes in sleep patterns.  Changes in appetite or eating patterns.  Feeling an overall lack of motivation or enjoyment of activities that you previously enjoyed.  Frequent crying spells. Talk with your health care provider if you think that you are experiencing depression. General instructions See your health care provider for regular wellness exams and vaccines. This may include:  Scheduling regular health, dental, and eye exams.  Getting and maintaining your vaccines. These include: ? Influenza vaccine. Get this vaccine each year before the flu season begins. ? Pneumonia vaccine. ? Shingles vaccine. ? Tetanus, diphtheria, and pertussis (Tdap) booster vaccine. Your health care provider may also recommend other immunizations. Tell your health care provider if you have ever been abused or do  not feel safe at home. Summary  Menopause is a normal process in which your ability to get pregnant comes to an end.  This condition causes hot flashes, night sweats, decreased interest in sex, mood swings, headaches, or lack of sleep.  Treatment for this condition may include hormone replacement therapy.  Take actions to keep yourself healthy, including exercising regularly, eating a healthy diet, watching your weight, and checking your blood pressure and blood sugar levels.  Get screened for cancer and depression. Make sure that you are up to date with all your vaccines. This information is not intended to replace advice given to you by your health care provider. Make sure you discuss any questions you have with your health care provider. Document Revised: 06/14/2018 Document Reviewed: 06/14/2018 Elsevier Patient Education  2021 [employer].

## 2020-09-26 NOTE — Assessment & Plan Note (Addendum)
Chronic off meds. Reviewed ASCVD risk.  The 10-year ASCVD risk score Mikey Bussing DC Brooke Bonito., et al., 2013) is: 6.6%   Values used to calculate the score:     Age: 65 years     Sex: Female     Is Non-Hispanic African American: No     Diabetic: No     Tobacco smoker: No     Systolic Blood Pressure: 861 mmHg     Is BP treated: Yes     HDL Cholesterol: 66.3 mg/dL     Total Cholesterol: 224 mg/dL

## 2020-09-27 ENCOUNTER — Telehealth: Payer: Self-pay | Admitting: Family Medicine

## 2020-09-27 DIAGNOSIS — Z853 Personal history of malignant neoplasm of breast: Secondary | ICD-10-CM

## 2020-09-27 DIAGNOSIS — H9319 Tinnitus, unspecified ear: Secondary | ICD-10-CM | POA: Insufficient documentation

## 2020-09-27 DIAGNOSIS — G8929 Other chronic pain: Secondary | ICD-10-CM | POA: Insufficient documentation

## 2020-09-27 DIAGNOSIS — Z7189 Other specified counseling: Secondary | ICD-10-CM | POA: Insufficient documentation

## 2020-09-27 DIAGNOSIS — R109 Unspecified abdominal pain: Secondary | ICD-10-CM | POA: Insufficient documentation

## 2020-09-27 NOTE — Assessment & Plan Note (Signed)
H/o this, has declined recheck in the past.

## 2020-09-27 NOTE — Assessment & Plan Note (Addendum)
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 Would also like recommendations for when to discontinue cervical cancer screening.

## 2020-09-27 NOTE — Assessment & Plan Note (Signed)
Advanced directive discussion - brings advanced directive - scanned 09/2020. Husband [name] and son [name] are HCPOA. Does not want prolonged life support if terminal condition. Does want artificial hydration/nutrition continued. HCPOA may override advanced directive.

## 2020-09-27 NOTE — Assessment & Plan Note (Signed)
Continues monthly boniva, sometimes forgets to take.  Discussed evista - rec continue boniva at this time.  Prolia unaffordable.  Due for updated DEXA - will order.

## 2020-09-27 NOTE — Assessment & Plan Note (Signed)
Chronic, stable. Continue lisinopril 40mg  daily + amlodipine 5mg  daily.

## 2020-09-27 NOTE — Assessment & Plan Note (Signed)
Chronic stable on current replacement (cal+vit D)

## 2020-09-27 NOTE — Telephone Encounter (Signed)
Patient requests yearly 3D screening mammogram to be completed at [facility] as well as updated DEXA scan at [facility]    I have ordered DEXA.  I have ordered screening mammo but please let me know if I need to change order any for this.  Thank you

## 2020-09-27 NOTE — Assessment & Plan Note (Signed)
Ongoing for over a year, with positional component suggesting MSK cause. Check UA - normal. Check renal US.

## 2020-09-27 NOTE — Assessment & Plan Note (Signed)
Gets yearly 3D mammograms at [facility] in [place].

## 2020-09-27 NOTE — Assessment & Plan Note (Signed)
Bilateral, present for >1 yr. Discussed possible med relation - she is on ACEI. Not bothersome to the point of wanting to change medication at this time. Will watch for now.

## 2020-09-29 NOTE — Telephone Encounter (Signed)
Orders faxed. Called and left message for the patient letting her know this.

## 2020-10-06 LAB — HM DEXA SCAN

## 2020-10-10 ENCOUNTER — Encounter: Payer: Self-pay | Admitting: Family Medicine

## 2020-10-10 DIAGNOSIS — M810 Age-related osteoporosis without current pathological fracture: Secondary | ICD-10-CM

## 2020-10-16 ENCOUNTER — Other Ambulatory Visit (INDEPENDENT_AMBULATORY_CARE_PROVIDER_SITE_OTHER): Payer: BC Managed Care – PPO

## 2020-10-16 DIAGNOSIS — Z1211 Encounter for screening for malignant neoplasm of colon: Secondary | ICD-10-CM

## 2020-10-16 LAB — FECAL OCCULT BLOOD, GUAIAC: Fecal Occult Blood: NEGATIVE

## 2020-10-16 LAB — FECAL OCCULT BLOOD, IMMUNOCHEMICAL: Fecal Occult Bld: NEGATIVE

## 2020-10-20 ENCOUNTER — Encounter: Payer: Self-pay | Admitting: Family Medicine

## 2020-10-29 ENCOUNTER — Ambulatory Visit: Payer: BC Managed Care – PPO

## 2020-11-27 ENCOUNTER — Encounter: Payer: Self-pay | Admitting: Family Medicine

## 2020-11-27 ENCOUNTER — Other Ambulatory Visit: Payer: Self-pay

## 2020-11-27 ENCOUNTER — Other Ambulatory Visit (HOSPITAL_COMMUNITY)
Admission: RE | Admit: 2020-11-27 | Discharge: 2020-11-27 | Disposition: A | Discharge status: Home / Self Care | Payer: BC Managed Care – PPO | Source: Ambulatory Visit | Attending: Family Medicine | Admitting: Family Medicine

## 2020-11-27 ENCOUNTER — Ambulatory Visit (INDEPENDENT_AMBULATORY_CARE_PROVIDER_SITE_OTHER): Payer: BC Managed Care – PPO | Admitting: Family Medicine

## 2020-11-27 DIAGNOSIS — Z01419 Encounter for gynecological examination (general) (routine) without abnormal findings: Secondary | ICD-10-CM

## 2020-11-27 DIAGNOSIS — Z124 Encounter for screening for malignant neoplasm of cervix: Secondary | ICD-10-CM | POA: Diagnosis not present

## 2020-11-27 NOTE — Progress Notes (Signed)
  Subjective:     Kristen Soto is a 65 y.o. female and is here for a comprehensive physical exam. The patient reports no problems.   The following portions of the patient's history were reviewed and updated as appropriate: allergies, current medications, past family history, past medical history, past social history, past surgical history and problem list.  Review of Systems Pertinent items noted in HPI and remainder of comprehensive ROS otherwise negative.   Objective:    BP 138/74   Pulse 61   Ht 5\' 2"  (1.575 m)   Wt 139 lb (63 kg)   LMP 10/29/2010   BMI 25.42 kg/m  General appearance: alert, cooperative and appears stated age Head: Normocephalic, without obvious abnormality, atraumatic Neck: no adenopathy, supple, symmetrical, trachea midline and thyroid not enlarged, symmetric, no tenderness/mass/nodules Lungs: clear to auscultation bilaterally Breasts: normal appearance, no masses or tenderness Heart: regular rate and rhythm Abdomen: soft, non-tender; bowel sounds normal; no masses,  no organomegaly Pelvic: cervix normal in appearance, external genitalia normal, no adnexal masses or tenderness, no cervical motion tenderness, uterus normal size, shape, and consistency and vaginal atrophy Extremities: extremities normal, atraumatic, no cyanosis or edema Pulses: 2+ and symmetric Skin: Skin color, texture, turgor normal. No rashes or lesions Lymph nodes: Cervical, supraclavicular, and axillary nodes normal. Neurologic: Grossly normal    Assessment:    GYN female exam.      Plan:  Screening for malignant neoplasm of cervix - should be her last pap needed. - Plan: Cytology - PAP  Encounter for gynecological examination without abnormal finding  Return in 1 year (on 11/27/2021).    See After Visit Summary for Counseling Recommendations

## 2020-12-02 LAB — CYTOLOGY - PAP
Comment: NEGATIVE
Diagnosis: NEGATIVE
High risk HPV: NEGATIVE

## 2021-01-16 LAB — HM MAMMOGRAPHY

## 2021-01-19 ENCOUNTER — Encounter: Payer: Self-pay | Admitting: Family Medicine

## 2021-07-22 DIAGNOSIS — H3561 Retinal hemorrhage, right eye: Secondary | ICD-10-CM | POA: Diagnosis not present

## 2021-07-22 DIAGNOSIS — H43813 Vitreous degeneration, bilateral: Secondary | ICD-10-CM | POA: Diagnosis not present

## 2021-07-22 DIAGNOSIS — H16142 Punctate keratitis, left eye: Secondary | ICD-10-CM | POA: Diagnosis not present

## 2021-09-01 ENCOUNTER — Other Ambulatory Visit: Payer: Self-pay | Admitting: Family Medicine

## 2021-09-15 ENCOUNTER — Ambulatory Visit: Payer: BC Managed Care – PPO | Admitting: Obstetrics & Gynecology

## 2021-10-06 ENCOUNTER — Ambulatory Visit (INDEPENDENT_AMBULATORY_CARE_PROVIDER_SITE_OTHER): Payer: Medicare (Managed Care) | Admitting: Obstetrics & Gynecology

## 2021-10-06 ENCOUNTER — Encounter: Payer: Self-pay | Admitting: Obstetrics & Gynecology

## 2021-10-06 VITALS — BP 160/83 | HR 62 | Ht 61.0 in | Wt 139.2 lb

## 2021-10-06 DIAGNOSIS — N644 Mastodynia: Secondary | ICD-10-CM | POA: Diagnosis not present

## 2021-10-06 DIAGNOSIS — Z853 Personal history of malignant neoplasm of breast: Secondary | ICD-10-CM | POA: Diagnosis not present

## 2021-10-06 NOTE — Progress Notes (Signed)
? ?  GYNECOLOGY OFFICE VISIT NOTE ? ?History:  ? Kristen Soto is a 66 y.o. [phone] here today for evaluation of persistent left breast pain for one month.  History of left breast cancer s/p lumpectomy, chemotherapy and radiation 17 years ago at [facility].  Has scans done at [facility], last mammogram on 01/16/2021 was normal as per report in [place]. She denies breast lumps or nipple drainage.  She also denies any abnormal vaginal discharge, bleeding, pelvic pain or other concerns.  ?  ?Past Medical History:  ?Diagnosis Date  ? Breast cancer ([place])   ? Left, s/p lumpectomy followed by chemotherapy and radiation  ? COVID-19 virus infection 06/2020  ? 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 Guidelines recommend yearly pelvic exam/pap smear of cervix and vagina and visualization of entire vagina due to increased risk of vaginal or cervical clear cell adenocarcinoma.   ? Hypertension   ? Osteoporosis   ? ? ?Past Surgical History:  ?Procedure Laterality Date  ? BASAL CELL CARCINOMA EXCISION  05/2008  ? removal, left eyelid  ? BREAST LUMPECTOMY    ? CYSTOSCOPY  02/02/2000  ? mild urethral trigonitis  ? ELBOW SURGERY Right 2019  ? ? ?The following portions of the patient's history were reviewed and updated as appropriate: allergies, current medications, past family history, past medical history, past social history, past surgical history and problem list.  ? ?Health Maintenance:  Normal pap and negative HRHPV on 11/27/2020.  Normal mammogram on 01/16/2021.  ? ?Review of Systems:  ?Pertinent items noted in HPI and remainder of comprehensive ROS otherwise negative. ? ?Physical Exam:  ?BP (!) 160/83 (BP Location: Left Arm, Patient Position: Sitting, Cuff Size: Normal)   Pulse 62   Ht '5\' 1"'$  (1.549 m)   Wt 139 lb 3.2 oz (63.1 kg)   LMP 10/29/2010   BMI 26.30 kg/m?  ?CONSTITUTIONAL: Well-developed, well-nourished female in no acute distress.  ?SKIN: Skin is warm and dry. No rash noted. Not diaphoretic. No erythema. No  pallor. ?NEUROLOGIC: Alert and oriented to person, place, and time. Normal reflexes, muscle tone coordination.  ?PSYCHIATRIC: Normal mood and affect. Normal behavior. Normal judgment and thought content. ?CARDIOVASCULAR: Normal heart rate noted, regular rhythm ?RESPIRATORY: Clear to auscultation bilaterally. Effort and breath sounds normal, no problems with respiration noted. ?BREASTS: Symmetric in size with right slightly larger than left. No masses, tenderness, skin changes, nipple drainage, or lymphadenopathy bilaterally. Healed scars on left breast. Done in the presence of chaperone. ?ABDOMEN: Soft, no distention noted.  ?PELVIC: Deferred ?   ?Assessment and Plan:  ?   ?1. Pain of left breast ?2. History of cancer of left breast ?Normal examination.  Imaging studies ordered for further evaluation. Advised Ibuprofen, warm compresses for now. ?- MM DIAG BREAST TOMO BILATERAL; Future ?- US BREAST LTD UNI LEFT INC AXILLA; Future ? ?Routine preventative health maintenance measures emphasized. ?Please refer to After Visit Summary for other counseling recommendations.  ? ?Return for any gynecologic concerns.   ? ?I spent 20 minutes dedicated to the care of this patient including pre-visit review of records, face to face time with the patient discussing her conditions and treatments and post visit orders. ? ? ? ?Verita Schneiders, MD, FACOG ?Obstetrician Social research officer, government, Faculty Practice ?[facility] ? ? ? ? ? ? ?

## 2021-10-12 ENCOUNTER — Other Ambulatory Visit: Payer: Self-pay | Admitting: *Deleted

## 2021-10-12 DIAGNOSIS — Z853 Personal history of malignant neoplasm of breast: Secondary | ICD-10-CM

## 2021-10-12 DIAGNOSIS — N644 Mastodynia: Secondary | ICD-10-CM

## 2021-10-20 ENCOUNTER — Encounter: Payer: Self-pay | Admitting: Obstetrics & Gynecology

## 2021-10-26 ENCOUNTER — Other Ambulatory Visit: Payer: BC Managed Care – PPO

## 2021-10-26 ENCOUNTER — Other Ambulatory Visit: Payer: Self-pay | Admitting: Family Medicine

## 2021-10-26 DIAGNOSIS — I10 Essential (primary) hypertension: Secondary | ICD-10-CM

## 2021-10-26 DIAGNOSIS — Z923 Personal history of irradiation: Secondary | ICD-10-CM | POA: Diagnosis not present

## 2021-10-26 DIAGNOSIS — Z9889 Other specified postprocedural states: Secondary | ICD-10-CM | POA: Diagnosis not present

## 2021-10-26 DIAGNOSIS — Z08 Encounter for follow-up examination after completed treatment for malignant neoplasm: Secondary | ICD-10-CM | POA: Diagnosis not present

## 2021-10-26 DIAGNOSIS — E042 Nontoxic multinodular goiter: Secondary | ICD-10-CM

## 2021-10-26 DIAGNOSIS — E785 Hyperlipidemia, unspecified: Secondary | ICD-10-CM

## 2021-10-26 DIAGNOSIS — Z853 Personal history of malignant neoplasm of breast: Secondary | ICD-10-CM | POA: Diagnosis not present

## 2021-10-26 DIAGNOSIS — N644 Mastodynia: Secondary | ICD-10-CM | POA: Diagnosis not present

## 2021-10-26 DIAGNOSIS — Z9221 Personal history of antineoplastic chemotherapy: Secondary | ICD-10-CM | POA: Diagnosis not present

## 2021-10-26 DIAGNOSIS — E559 Vitamin D deficiency, unspecified: Secondary | ICD-10-CM

## 2021-10-27 ENCOUNTER — Other Ambulatory Visit: Payer: BC Managed Care – PPO

## 2021-10-27 ENCOUNTER — Other Ambulatory Visit (INDEPENDENT_AMBULATORY_CARE_PROVIDER_SITE_OTHER): Payer: Medicare (Managed Care)

## 2021-10-27 DIAGNOSIS — E559 Vitamin D deficiency, unspecified: Secondary | ICD-10-CM

## 2021-10-27 DIAGNOSIS — I10 Essential (primary) hypertension: Secondary | ICD-10-CM | POA: Diagnosis not present

## 2021-10-27 DIAGNOSIS — E042 Nontoxic multinodular goiter: Secondary | ICD-10-CM | POA: Diagnosis not present

## 2021-10-27 DIAGNOSIS — E785 Hyperlipidemia, unspecified: Secondary | ICD-10-CM

## 2021-10-27 LAB — LIPID PANEL
Cholesterol: 239 mg/dL — ABNORMAL HIGH (ref 0–200)
HDL: 75.9 mg/dL (ref >39.00)
LDL Cholesterol: 140 mg/dL — ABNORMAL HIGH (ref 0–99)
NonHDL: 162.77
Total CHOL/HDL Ratio: 3
Triglycerides: 112 mg/dL (ref 0.0–149.0)
VLDL: 22.4 mg/dL (ref 0.0–40.0)

## 2021-10-27 LAB — CBC WITH DIFFERENTIAL/PLATELET
Basophils Absolute: 0.1 10*3/uL (ref 0.0–0.1)
Basophils Relative: 1.2 % (ref 0.0–3.0)
Eosinophils Absolute: 0.1 10*3/uL (ref 0.0–0.7)
Eosinophils Relative: 1.6 % (ref 0.0–5.0)
HCT: 40.8 % (ref 36.0–46.0)
Hemoglobin: 14 g/dL (ref 12.0–15.0)
Lymphocytes Relative: 32.3 % (ref 12.0–46.0)
Lymphs Abs: 1.7 10*3/uL (ref 0.7–4.0)
MCHC: 34.4 g/dL (ref 30.0–36.0)
MCV: 90.8 fl (ref 78.0–100.0)
Monocytes Absolute: 0.3 10*3/uL (ref 0.1–1.0)
Monocytes Relative: 6.1 % (ref 3.0–12.0)
Neutro Abs: 3.1 10*3/uL (ref 1.4–7.7)
Neutrophils Relative %: 58.8 % (ref 43.0–77.0)
Platelets: 235 10*3/uL (ref 150.0–400.0)
RBC: 4.5 Mil/uL (ref 3.87–5.11)
RDW: 12.5 % (ref 11.5–15.5)
WBC: 5.2 10*3/uL (ref 4.0–10.5)

## 2021-10-27 LAB — COMPREHENSIVE METABOLIC PANEL
ALT: 22 U/L (ref 0–35)
AST: 22 U/L (ref 0–37)
Albumin: 4.8 g/dL (ref 3.5–5.2)
Alkaline Phosphatase: 55 U/L (ref 39–117)
BUN: 15 mg/dL (ref 6–23)
CO2: 28 mEq/L (ref 19–32)
Calcium: 9.3 mg/dL (ref 8.4–10.5)
Chloride: 104 mEq/L (ref 96–112)
Creatinine, Ser: 0.79 mg/dL (ref 0.40–1.20)
GFR: 78.72 mL/min (ref >60.00)
Glucose, Bld: 84 mg/dL (ref 70–99)
Potassium: 3.9 mEq/L (ref 3.5–5.1)
Sodium: 139 mEq/L (ref 135–145)
Total Bilirubin: 0.7 mg/dL (ref 0.2–1.2)
Total Protein: 7 g/dL (ref 6.0–8.3)

## 2021-10-27 LAB — VITAMIN D 25 HYDROXY (VIT D DEFICIENCY, FRACTURES): VITD: 48.52 ng/mL (ref 30.00–100.00)

## 2021-10-27 LAB — TSH: TSH: 1.26 u[IU]/mL (ref 0.35–5.50)

## 2021-10-30 ENCOUNTER — Encounter: Payer: BC Managed Care – PPO | Admitting: Family Medicine

## 2021-11-09 ENCOUNTER — Other Ambulatory Visit: Payer: BC Managed Care – PPO

## 2021-11-13 ENCOUNTER — Ambulatory Visit (INDEPENDENT_AMBULATORY_CARE_PROVIDER_SITE_OTHER): Payer: Medicare (Managed Care) | Admitting: Family Medicine

## 2021-11-13 VITALS — BP 144/82 | HR 65 | Temp 97.7°F | Ht 60.5 in | Wt 139.5 lb

## 2021-11-13 DIAGNOSIS — N393 Stress incontinence (female) (male): Secondary | ICD-10-CM | POA: Diagnosis not present

## 2021-11-13 DIAGNOSIS — M810 Age-related osteoporosis without current pathological fracture: Secondary | ICD-10-CM

## 2021-11-13 DIAGNOSIS — R109 Unspecified abdominal pain: Secondary | ICD-10-CM | POA: Diagnosis not present

## 2021-11-13 DIAGNOSIS — Z7189 Other specified counseling: Secondary | ICD-10-CM | POA: Diagnosis not present

## 2021-11-13 DIAGNOSIS — I10 Essential (primary) hypertension: Secondary | ICD-10-CM | POA: Diagnosis not present

## 2021-11-13 DIAGNOSIS — E785 Hyperlipidemia, unspecified: Secondary | ICD-10-CM | POA: Diagnosis not present

## 2021-11-13 DIAGNOSIS — Z23 Encounter for immunization: Secondary | ICD-10-CM | POA: Diagnosis not present

## 2021-11-13 DIAGNOSIS — Z1211 Encounter for screening for malignant neoplasm of colon: Secondary | ICD-10-CM | POA: Insufficient documentation

## 2021-11-13 DIAGNOSIS — Z853 Personal history of malignant neoplasm of breast: Secondary | ICD-10-CM

## 2021-11-13 DIAGNOSIS — E559 Vitamin D deficiency, unspecified: Secondary | ICD-10-CM | POA: Diagnosis not present

## 2021-11-13 DIAGNOSIS — Z9189 Other specified personal risk factors, not elsewhere classified: Secondary | ICD-10-CM

## 2021-11-13 DIAGNOSIS — Z0000 Encounter for general adult medical examination without abnormal findings: Secondary | ICD-10-CM | POA: Diagnosis not present

## 2021-11-13 DIAGNOSIS — G8929 Other chronic pain: Secondary | ICD-10-CM

## 2021-11-13 LAB — POC URINALSYSI DIPSTICK (AUTOMATED)
Bilirubin, UA: NEGATIVE
Blood, UA: NEGATIVE
Glucose, UA: NEGATIVE
Ketones, UA: NEGATIVE
Leukocytes, UA: NEGATIVE
Nitrite, UA: NEGATIVE
Protein, UA: NEGATIVE
Spec Grav, UA: 1.01 (ref 1.010–1.025)
Urobilinogen, UA: 0.2 E.U./dL
pH, UA: 6.5 (ref 5.0–8.0)

## 2021-11-13 MED ORDER — AMLODIPINE BESYLATE 5 MG PO TABS: 5.0000 mg | ORAL_TABLET | Freq: Every day | ORAL | 3 refills | Status: DC

## 2021-11-13 MED ORDER — VALSARTAN 160 MG PO TABS: 160.0000 mg | ORAL_TABLET | Freq: Every day | ORAL | 3 refills | Status: DC

## 2021-11-13 NOTE — Progress Notes (Signed)
? ? Patient ID: Kristen Soto, female    DOB: [birth year], 66 y.o.   MRN: [id number] ? ?This visit was conducted in person. ? ?BP (!) 144/82 (BP Location: Right Arm, Cuff Size: Normal)   Pulse 65   Temp 97.7 ?F (36.5 ?C) (Temporal)   Ht 5' 0.5" (1.537 m)   Wt 139 lb 8 oz (63.3 kg)   LMP 10/29/2010   SpO2 98%   BMI 26.80 kg/m?   ?154/80 on retesting ? ?CC: Welcome to Medicare visit  ?Subjective:  ? ?HPI: ?Kristen Soto is a 66 y.o. female presenting on 11/13/2021 for Welcome to Medicare Exam ? ? ?Hearing Screening  ? '500Hz'$  '1000Hz'$  '2000Hz'$  '4000Hz'$   ?Right ear '20 25 20 '$ 0  ?Left ear '25 25 20 '$ 0  ? ?Vision Screening  ? Right eye Left eye Both eyes  ?Without correction     ?With correction '20/25 20/25 20/20 '$  ?  ?Personnel officer Visit from 11/13/2021 in [place] at [place]  ?PHQ-2 Total Score 0  ? ?  ?  ? ?  11/13/2021  ?  9:49 AM 03/16/2017  ? 10:45 AM  ?Fall Risk   ?Falls in the past year?  No  ?Number falls in past yr: 0   ?Injury with Fall? 0   ? ? ?Preventative: ?Colon cancer screening - normal colonoscopy at [facility] (I can't see report) 12/2007. Requests yearly stool kits since. Last normal 10/2020. Rpt today.  ?H/o breast cancer ~2006 s/p remote chemotherapy as well as tamoxifen use - gets yearly mammograms at [facility]), latest mammo 01/2021 Birads2. S/p mammo/US for L breast for pain 10/2021 (ordered by GYN) per pt normal.  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 Guidelines recommend yearly pelvic exam/pap smear of cervix and vagina and visualization of entire vagina due to increased risk of vaginal or cervical clear cell adenocarcinoma. Established with GYN last year (Dr Harolyn Rutherford) - s/p normal pap 11/2020, told ok to stop cervical cancer screening.  ?DEXA 10/2020 - T -2.7 spine, -1.2 L femur at [facility] ?h/o osteoporosis managed on monthly boniva (sometimes forgets) and regular calcium, vit D, weight bearing exercise. Previously on prolia but became too expensive.  ?Lung cancer screening -  not eligible  ?Flu shot - declines  ?COVID vaccine - discussed, declines  ?[id number] today ?Td 2003, Tdap 2014  ?Shingrix - completed (2018, 2019) ?Advanced directive discussion - scanned 09/2020. Husband [name] and son [name] are HCPOA. Does not want prolonged life support if terminal condition. Does want artificial hydration/nutrition continued. HCPOA may override advanced directive.  ?Seat belt use discussed ?Sunscreen use discussed. No changing moles on skin. Sees derm regularly.  ?Ex smoker, quit 30 yrs ago.  ?Alcohol - a few glasses wine per week  ?Dentist - q6 mo ?Eye exam - yearly ?Bowel - occ constipation managed effectively with senna tea  ?Bladder - occ stress incontinence  ? ?Lives with husband Shanon Brow ?Occ: retired 2015, was Nature conservation officer for [employer] ?Act: no regular exercise ?Diet: good water, fruits/vegetables daily ?   ? ?Relevant past medical, surgical, family and social history reviewed and updated as indicated. Interim medical history since our last visit reviewed. ?Allergies and medications reviewed and updated. ?Outpatient Medications Prior to Visit  ?Medication Sig Dispense Refill  ? Calcium Carb-Cholecalciferol (CALCIUM-VITAMIN D) 600-400 MG-UNIT TABS Take 1 tablet by mouth daily. 60 tablet   ? ibandronate (BONIVA) 150 MG tablet Take 1 tablet (150 mg total) by mouth every  30 (thirty) days. Take in the morning with a full glass of water, on an empty stomach, and do not take anything else by mouth or lie down for the next 30 min. 3 tablet 3  ? Multiple Vitamin (MULTIVITAMIN) tablet Take 1 tablet by mouth daily.    ? Omega-3 Fatty Acids (FISH OIL PO) Take by mouth daily.    ? amLODipine (NORVASC) 5 MG tablet TAKE 1 TABLET DAILY 90 tablet 0  ? lisinopril (ZESTRIL) 40 MG tablet TAKE 1 TABLET DAILY 90 tablet 0  ? ?No facility-administered medications prior to visit.  ?  ? ?Per HPI unless specifically indicated in ROS section below ?Review of Systems ? ?Objective:  ?BP (!) 144/82 (BP Location:  Right Arm, Cuff Size: Normal)   Pulse 65   Temp 97.7 ?F (36.5 ?C) (Temporal)   Ht 5' 0.5" (1.537 m)   Wt 139 lb 8 oz (63.3 kg)   LMP 10/29/2010   SpO2 98%   BMI 26.80 kg/m?   ?Wt Readings from Last 3 Encounters:  ?11/13/21 139 lb 8 oz (63.3 kg)  ?10/06/21 139 lb 3.2 oz (63.1 kg)  ?11/27/20 139 lb (63 kg)  ?  ?  ?Physical Exam ?Vitals and nursing note reviewed.  ?Constitutional:   ?   Appearance: Normal appearance. She is not ill-appearing.  ?HENT:  ?   Head: Normocephalic and atraumatic.  ?   Right Ear: Tympanic membrane, ear canal and external ear normal. There is no impacted cerumen.  ?   Left Ear: Tympanic membrane, ear canal and external ear normal. There is no impacted cerumen.  ?   Nose: Nose normal.  ?   Mouth/Throat:  ?   Mouth: Mucous membranes are moist.  ?   Pharynx: Oropharynx is clear. No oropharyngeal exudate or posterior oropharyngeal erythema.  ?Eyes:  ?   General:     ?   Right eye: No discharge.     ?   Left eye: No discharge.  ?   Extraocular Movements: Extraocular movements intact.  ?   Conjunctiva/sclera: Conjunctivae normal.  ?   Pupils: Pupils are equal, round, and reactive to light.  ?Neck:  ?   Thyroid: No thyroid mass or thyromegaly.  ?   Vascular: No carotid bruit.  ?Cardiovascular:  ?   Rate and Rhythm: Normal rate and regular rhythm.  ?   Pulses: Normal pulses.  ?   Heart sounds: Normal heart sounds. No murmur heard. ?Pulmonary:  ?   Effort: Pulmonary effort is normal. No respiratory distress.  ?   Breath sounds: Normal breath sounds. No wheezing, rhonchi or rales.  ?Abdominal:  ?   General: Bowel sounds are normal. There is no distension.  ?   Palpations: Abdomen is soft. There is no mass.  ?   Tenderness: There is no abdominal tenderness. There is no right CVA tenderness, left CVA tenderness, guarding or rebound.  ?   Hernia: No hernia is present.  ?Musculoskeletal:     ?   General: No tenderness or deformity. Normal range of motion.  ?   Cervical back: Normal range of motion  and neck supple. No rigidity.  ?   Right lower leg: No edema.  ?   Left lower leg: No edema.  ?   Comments:  ?No midline thoracic spine pain ?No significant thoracic paraspinous mm tenderness ?Mild discomfort to R flank   ?Lymphadenopathy:  ?   Cervical: No cervical adenopathy.  ?Skin: ?   General: Skin is  warm and dry.  ?   Findings: No rash.  ?Neurological:  ?   General: No focal deficit present.  ?   Mental Status: She is alert. Mental status is at baseline.  ?   Comments:  ?Recall 3/3 ?Calculation 5/5 DLROW  ?Psychiatric:     ?   Mood and Affect: Mood normal.     ?   Behavior: Behavior normal.  ? ?   ?Results for orders placed or performed in visit on 11/13/21  ?POCT Urinalysis Dipstick (Automated)  ?Result Value Ref Range  ? Color, UA yellow   ? Clarity, UA clear   ? Glucose, UA Negative Negative  ? Bilirubin, UA negative   ? Ketones, UA negative   ? Spec Grav, UA 1.010 1.010 - 1.025  ? Blood, UA negative   ? pH, UA 6.5 5.0 - 8.0  ? Protein, UA Negative Negative  ? Urobilinogen, UA 0.2 0.2 or 1.0 E.U./dL  ? Nitrite, UA negative   ? Leukocytes, UA Negative Negative  ? ?Lab Results  ?Component Value Date  ? CHOL 239 (H) 10/27/2021  ? HDL 75.90 10/27/2021  ? LDLCALC 140 (H) 10/27/2021  ? LDLDIRECT 117.3 06/12/2013  ? TRIG 112.0 10/27/2021  ? CHOLHDL 3 10/27/2021  ?  ?Lab Results  ?Component Value Date  ? WBC 5.2 10/27/2021  ? HGB 14.0 10/27/2021  ? HCT 40.8 10/27/2021  ? MCV 90.8 10/27/2021  ? PLT 235.0 10/27/2021  ?  ?Lab Results  ?Component Value Date  ? CREATININE 0.79 10/27/2021  ? BUN 15 10/27/2021  ? NA 139 10/27/2021  ? K 3.9 10/27/2021  ? CL 104 10/27/2021  ? CO2 28 10/27/2021  ?  ?EKG - sinus bradycardia 50s, normal axis, intervals, no hypertrophy or acute ST/T changes. rSR V1 with poor R wave progression, unchanged from prior. ? ?Assessment & Plan:  ? ?Problem List Items Addressed This Visit   ? ? Advanced directives, counseling/discussion (Chronic)  ?  Advanced directive discussion - scanned 09/2020.  Husband [name] and son [name] are HCPOA. Does not want prolonged life support if terminal condition. Does want artificial hydration/nutrition continued. HCPOA may override advanced directive.  ?  ?  ? Welcome t

## 2021-11-13 NOTE — Assessment & Plan Note (Addendum)

## 2021-11-13 NOTE — Patient Instructions (Addendum)
Urinalysis today  ?Pass by lab to pick up stool kit. ?We will price out prolia for you in place of boniva and be in touch.  ?Pneumonia shot today prevnar-20.  ?May use kegel exercises as needed.  ?BP was too high today. Start monitoring BP at home, let me know if consistently >140/90. Change lisinopril to valsartan 137m daily. Continue amlodipine 534mdaily.  ?Good to see you today ?Return in 3 months for BP f/u visit  ?Return as needed or in 1 year for next medicare wellness visit.  ? ?Health Maintenance After Age 1873After age 6150you are at a higher risk for certain long-term diseases and infections as well as injuries from falls. Falls are a major cause of broken bones and head injuries in people who are older than age 6189Getting regular preventive care can help to keep you healthy and well. Preventive care includes getting regular testing and making lifestyle changes as recommended by your health care provider. Talk with your health care provider about: ?Which screenings and tests you should have. A screening is a test that checks for a disease when you have no symptoms. ?A diet and exercise plan that is right for you. ?What should I know about screenings and tests to prevent falls? ?Screening and testing are the best ways to find a health problem early. Early diagnosis and treatment give you the best chance of managing medical conditions that are common after age 6133Certain conditions and lifestyle choices may make you more likely to have a fall. Your health care provider may recommend: ?Regular vision checks. Poor vision and conditions such as cataracts can make you more likely to have a fall. If you wear glasses, make sure to get your prescription updated if your vision changes. ?Medicine review. Work with your health care provider to regularly review all of the medicines you are taking, including over-the-counter medicines. Ask your health care provider about any side effects that may make you more likely  to have a fall. Tell your health care provider if any medicines that you take make you feel dizzy or sleepy. ?Strength and balance checks. Your health care provider may recommend certain tests to check your strength and balance while standing, walking, or changing positions. ?Foot health exam. Foot pain and numbness, as well as not wearing proper footwear, can make you more likely to have a fall. ?Screenings, including: ?Osteoporosis screening. Osteoporosis is a condition that causes the bones to get weaker and break more easily. ?Blood pressure screening. Blood pressure changes and medicines to control blood pressure can make you feel dizzy. ?Depression screening. You may be more likely to have a fall if you have a fear of falling, feel depressed, or feel unable to do activities that you used to do. ?Alcohol use screening. Using too much alcohol can affect your balance and may make you more likely to have a fall. ?Follow these instructions at home: ?Lifestyle ?Do not drink alcohol if: ?Your health care provider tells you not to drink. ?If you drink alcohol: ?Limit how much you have to: ?0-1 drink a day for women. ?0-2 drinks a day for men. ?Know how much alcohol is in your drink. In the [place] one drink equals one 12 oz bottle of beer (355 mL), one 5 oz glass of wine (148 mL), or one 1? oz glass of hard liquor (44 mL). ?Do not use any products that contain nicotine or tobacco. These products include cigarettes, chewing tobacco, and vaping devices, such as  e-cigarettes. If you need help quitting, ask your health care provider. ?Activity ? ?Follow a regular exercise program to stay fit. This will help you maintain your balance. Ask your health care provider what types of exercise are appropriate for you. ?If you need a cane or walker, use it as recommended by your health care provider. ?Wear supportive shoes that have nonskid soles. ?Safety ? ?Remove any tripping hazards, such as rugs, cords, and clutter. ?Install  safety equipment such as grab bars in bathrooms and safety rails on stairs. ?Keep rooms and walkways well-lit. ?General instructions ?Talk with your health care provider about your risks for falling. Tell your health care provider if: ?You fall. Be sure to tell your health care provider about all falls, even ones that seem minor. ?You feel dizzy, tiredness (fatigue), or off-balance. ?Take over-the-counter and prescription medicines only as told by your health care provider. These include supplements. ?Eat a healthy diet and maintain a healthy weight. A healthy diet includes low-fat dairy products, low-fat (lean) meats, and fiber from whole grains, beans, and lots of fruits and vegetables. ?Stay current with your vaccines. ?Schedule regular health, dental, and eye exams. ?Summary ?Having a healthy lifestyle and getting preventive care can help to protect your health and wellness after age 45. ?Screening and testing are the best way to find a health problem early and help you avoid having a fall. Early diagnosis and treatment give you the best chance for managing medical conditions that are more common for people who are older than age 2. ?Falls are a major cause of broken bones and head injuries in people who are older than age 53. Take precautions to prevent a fall at home. ?Work with your health care provider to learn what changes you can make to improve your health and wellness and to prevent falls. ?This information is not intended to replace advice given to you by your health care provider. Make sure you discuss any questions you have with your health care provider. ?Document Revised: 11/10/2020 Document Reviewed: 11/10/2020 ?Elsevier Patient Education ? [place]. ?Monitor blood pressures  ?

## 2021-11-14 ENCOUNTER — Telehealth: Payer: Self-pay | Admitting: Family Medicine

## 2021-11-14 ENCOUNTER — Encounter: Payer: Self-pay | Admitting: Family Medicine

## 2021-11-14 DIAGNOSIS — N393 Stress incontinence (female) (male): Secondary | ICD-10-CM | POA: Insufficient documentation

## 2021-11-14 DIAGNOSIS — M810 Age-related osteoporosis without current pathological fracture: Secondary | ICD-10-CM

## 2021-11-14 NOTE — Assessment & Plan Note (Addendum)
Present for years. Check UA today - normal.  ?Previously did not complete renal US.  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  ?Will offer thoracic films for further evaluation. ?

## 2021-11-14 NOTE — Assessment & Plan Note (Signed)
Continues yearly mammograms, latest through [facility] by GYN.  ?

## 2021-11-14 NOTE — Assessment & Plan Note (Signed)
Latest DEXA 2022 reviewed.  ?Continue calcium, vit D and regular weight bearing exercises.  ?On bisphosphonate for >5 yrs. She occasionally forgets her monthly boniva. Interested in pricing out prolia now that she has medicare - will price out and be in touch.  ?

## 2021-11-14 NOTE — Assessment & Plan Note (Signed)
Chronic, stable - continue cal/vit D. ?

## 2021-11-14 NOTE — Assessment & Plan Note (Addendum)
BP elevated today - will change losartan to more potent ARB (Valsartan), I also recommended she start monitoring BP at home and notify me if persistently elevated. I also asked her to return in 3 months for HTN f/u visit.  ?

## 2021-11-14 NOTE — Assessment & Plan Note (Addendum)
Chronic, deteriorated control off medication.  ?Encouraged healthy diet options to help control cholesterol levels.  ?The 10-year ASCVD risk score (Arnett DK, et al., 2019) is: 9% ?  Values used to calculate the score: ?    Age: 67 years ?    Sex: Female ?    Is Non-Hispanic African American: No ?    Diabetic: No ?    Tobacco smoker: No ?    Systolic Blood Pressure: 158 mmHg ?    Is BP treated: Yes ?    HDL Cholesterol: 75.9 mg/dL ?    Total Cholesterol: 239 mg/dL  ?

## 2021-11-14 NOTE — Assessment & Plan Note (Signed)
Advanced directive discussion - scanned 09/2020. Husband [name] and son [name] are HCPOA. Does not want prolonged life support if terminal condition. Does want artificial hydration/nutrition continued. HCPOA may override advanced directive.  ?

## 2021-11-14 NOTE — Assessment & Plan Note (Signed)
Mild. Discussed kegel exercises.  ?

## 2021-11-14 NOTE — Telephone Encounter (Signed)
Pt with OP on bisphosphonate for >5 yrs.  ?Can we price out prolia for patient? Thanks.  ?

## 2021-11-16 NOTE — Telephone Encounter (Signed)
Benefits submitted, [employer] ID [id number] ?

## 2021-11-24 NOTE — Telephone Encounter (Signed)
Spoke to [employer] rep, still working on this benefit check. Will re check in a few days again.

## 2021-12-07 NOTE — Telephone Encounter (Addendum)
We could set her up for Reclast infusion if she's ok going to [place] as I can't order infusion in [place]. This is same family of medicines as boniva but it's once a year.  Don't know how much the reclast would cost, but our infusion center would submit all the info to her insurance.

## 2021-12-07 NOTE — Telephone Encounter (Addendum)
Spoke with pt relaying Dr. Synthia Innocent message.  Pt verbalizes understanding and agrees to Reclast infusion in [place].

## 2021-12-07 NOTE — Telephone Encounter (Signed)
Benefits received needs PA, OOP cost is $301. Patient would like to know if there is anything else she can try. She does not mind infusions if that is the only other option.

## 2021-12-08 ENCOUNTER — Telehealth: Payer: Self-pay | Admitting: Pharmacy Technician

## 2021-12-08 ENCOUNTER — Encounter: Payer: Self-pay | Admitting: Family Medicine

## 2021-12-08 NOTE — Addendum Note (Signed)
Addended by: Ria Bush on: 12/08/2021 07:54 AM   Modules accepted: Orders

## 2021-12-08 NOTE — Telephone Encounter (Signed)
Thanks

## 2021-12-08 NOTE — Telephone Encounter (Signed)
Reclast ordered.

## 2021-12-08 NOTE — Telephone Encounter (Signed)
Dr. Leo Grosser, Juluis Rainier note:  Auth Submission: no auth needed Payer: [employer] medicare Medication & CPT/J Code(s) submitted: Reclast (Zolendronic acid) [id number] Route of submission (phone, fax, portal): phone: [phone] Auth type: Buy/Bill Units/visits requested: x1 dose Reference number:  Approval from: 12/08/21 to 06/09/22   Patient will be scheduled as soon as possible.

## 2021-12-30 ENCOUNTER — Ambulatory Visit (INDEPENDENT_AMBULATORY_CARE_PROVIDER_SITE_OTHER): Payer: Medicare (Managed Care)

## 2021-12-30 VITALS — BP 158/88 | HR 52 | Temp 97.8°F | Resp 18 | Ht 61.0 in | Wt 141.2 lb

## 2021-12-30 DIAGNOSIS — M810 Age-related osteoporosis without current pathological fracture: Secondary | ICD-10-CM

## 2021-12-30 MED ORDER — ACETAMINOPHEN 325 MG PO TABS
650.0000 mg | ORAL_TABLET | Freq: Once | ORAL | Status: AC
  Administered 2021-12-30: 650 mg via ORAL
  Filled 2021-12-30: qty 2

## 2021-12-30 MED ORDER — ZOLEDRONIC ACID 5 MG/100ML IV SOLN
5.0000 mg | Freq: Once | INTRAVENOUS | Status: AC
  Administered 2021-12-30: 5 mg via INTRAVENOUS
  Filled 2021-12-30: qty 100

## 2021-12-30 MED ORDER — DIPHENHYDRAMINE HCL 25 MG PO CAPS
25.0000 mg | ORAL_CAPSULE | Freq: Once | ORAL | Status: AC
  Administered 2021-12-30: 25 mg via ORAL
  Filled 2021-12-30: qty 1

## 2021-12-30 MED ORDER — SODIUM CHLORIDE 0.9 % IV SOLN: INTRAVENOUS | Status: DC

## 2021-12-30 NOTE — Progress Notes (Signed)
Diagnosis: Osteoporosis  Provider:  Marshell Garfinkel, MD  Procedure: Infusion  IV Type: Peripheral, IV Location: L Antecubital  Reclast (Zolendronic Acid), Dose: 5 mg  Infusion Start Time: 1460  Infusion Stop Time: 1147  Post Infusion IV Care: Observation period completed and Peripheral IV Discontinued  Discharge: Condition: Good, Destination: Home . AVS provided to patient.   Performed by:  Cleophus Molt, RN

## 2021-12-30 NOTE — Patient Instructions (Signed)

## 2022-02-10 ENCOUNTER — Other Ambulatory Visit: Payer: Self-pay

## 2022-02-10 MED ORDER — VALSARTAN 160 MG PO TABS
160.0000 mg | ORAL_TABLET | Freq: Every day | ORAL | 3 refills | Status: DC
Start: 2022-02-10 — End: 2022-02-11

## 2022-02-10 NOTE — Telephone Encounter (Signed)
ERx 

## 2022-02-10 NOTE — Telephone Encounter (Signed)
Pt states she has OV on 02/16/22 but took her last valsartan pill today.  She requests a 1 wk refill to get her to her appt.

## 2022-02-11 ENCOUNTER — Telehealth: Payer: Self-pay | Admitting: Family Medicine

## 2022-02-11 MED ORDER — VALSARTAN 160 MG PO TABS: 160.0000 mg | ORAL_TABLET | Freq: Every day | ORAL | 2 refills | Status: DC

## 2022-02-11 NOTE — Telephone Encounter (Signed)
Patient called in regarding a prescription. Stated she missed a call from someone. Thank you!

## 2022-02-11 NOTE — Telephone Encounter (Signed)
Spoke with pt notifying her Dr. Darnell Level sent Valsartan refill to [facility].  Pt verbalizes understanding and will pick it up today.  However, she requests rx be sent to [facility] also.   E-scribed rx to [facility].

## 2022-02-11 NOTE — Addendum Note (Signed)
Addended by: Brenton Grills on: 11/30/4130 44:01 PM   Modules accepted: Orders

## 2022-02-16 ENCOUNTER — Ambulatory Visit (INDEPENDENT_AMBULATORY_CARE_PROVIDER_SITE_OTHER)
Admission: RE | Admit: 2022-02-16 | Discharge: 2022-02-16 | Disposition: A | Discharge status: Home / Self Care | Payer: Medicare (Managed Care) | Source: Ambulatory Visit | Attending: Family Medicine | Admitting: Family Medicine

## 2022-02-16 ENCOUNTER — Ambulatory Visit: Payer: Medicare (Managed Care) | Admitting: Family Medicine

## 2022-02-16 ENCOUNTER — Encounter: Payer: Self-pay | Admitting: Family Medicine

## 2022-02-16 VITALS — BP 134/78 | HR 65 | Temp 97.6°F | Ht 61.0 in | Wt 140.4 lb

## 2022-02-16 DIAGNOSIS — Z1211 Encounter for screening for malignant neoplasm of colon: Secondary | ICD-10-CM | POA: Diagnosis not present

## 2022-02-16 DIAGNOSIS — M4125 Other idiopathic scoliosis, thoracolumbar region: Secondary | ICD-10-CM | POA: Diagnosis not present

## 2022-02-16 DIAGNOSIS — M419 Scoliosis, unspecified: Secondary | ICD-10-CM | POA: Insufficient documentation

## 2022-02-16 DIAGNOSIS — M810 Age-related osteoporosis without current pathological fracture: Secondary | ICD-10-CM

## 2022-02-16 DIAGNOSIS — I10 Essential (primary) hypertension: Secondary | ICD-10-CM

## 2022-02-16 DIAGNOSIS — M545 Low back pain, unspecified: Secondary | ICD-10-CM

## 2022-02-16 NOTE — Assessment & Plan Note (Signed)
Chronic R lower back pain present for years, worse with certain physical activity or after prolonged sitting.  Reviewed films from 2021 showing possible bulging disc as well as degenerative facet changes. Possible progression of scoliosis based on exam today - update lumbar films.  Provided with lower back exercises from [facility] pt advisor.  Discussed ortho eval if worsening.

## 2022-02-16 NOTE — Patient Instructions (Addendum)
We will repeat bone density scan 10/2022.  You are doing well today!  Continue current medicines.  For right lower back pain - xrays today. Some scoliosis on exam - likely contributing to discomfort. Lower back stretching/strengthening exercises provided today. If worsening or not improving with this, consider evaluation by [facility] in [place], let us know if you would like a referral for this.

## 2022-02-16 NOTE — Assessment & Plan Note (Addendum)
Chronic, improved readings with recent med changes (from ACEI to ARB). Continue current regimen which she is tolerating well.

## 2022-02-16 NOTE — Progress Notes (Signed)
Patient ID: Kristen Soto, female    DOB: 1956-05-17, 66 y.o.   MRN: [id number]  This visit was conducted in person.  BP 134/78   Pulse 65   Temp 97.6 F (36.4 C) (Temporal)   Ht '5\' 1"'$  (1.549 m)   Wt 140 lb 6 oz (63.7 kg)   LMP 10/29/2010   SpO2 97%   BMI 26.52 kg/m    CC: HTN f/u visit  Subjective:   HPI: Kristen Soto is a 66 y.o. female presenting on 02/16/2022 for Hypertension (Here for 3 mo f/u. Pt states she coughs less than with lisinopril but still has minor cough. )   Recent trip to [place]!   HTN - Compliant with current antihypertensive regimen of valsartan '160mg'$  daily, amlodipine '5mg'$  daily. Valsartan replaced lisinopril due to dry cough. Does check blood pressures at home: 130/70s. No low blood pressure readings or symptoms of dizziness/syncope. Denies HA, vision changes, CP/tightness, SOB. Mild ankle swelling.   Osteoporosis - received first reclast '5mg'$  12/30/2021.   By the way - ongoing R lower back pain for years, worse after sex, worse after prolonged sitting. No inciting trauma/injury or fall. No radiculopathy symptoms (numbness, weakness, shooting pain down leg). No bowel/bladder accidents.      Relevant past medical, surgical, family and social history reviewed and updated as indicated. Interim medical history since our last visit reviewed. Allergies and medications reviewed and updated. Outpatient Medications Prior to Visit  Medication Sig Dispense Refill   amLODipine (NORVASC) 5 MG tablet Take 1 tablet (5 mg total) by mouth daily. 90 tablet 3   Calcium Carb-Cholecalciferol (CALCIUM-VITAMIN D) 600-400 MG-UNIT TABS Take 1 tablet by mouth daily. 60 tablet    Multiple Vitamin (MULTIVITAMIN) tablet Take 1 tablet by mouth daily.     Omega-3 Fatty Acids (FISH OIL PO) Take by mouth daily.     valsartan (DIOVAN) 160 MG tablet Take 1 tablet (160 mg total) by mouth daily. 90 tablet 2   zoledronic acid (RECLAST) 5 MG/100ML SOLN injection Inject 100 mLs (5 mg total)  into the vein. Repeat yearly     No facility-administered medications prior to visit.     Per HPI unless specifically indicated in ROS section below Review of Systems  Objective:  BP 134/78   Pulse 65   Temp 97.6 F (36.4 C) (Temporal)   Ht '5\' 1"'$  (1.549 m)   Wt 140 lb 6 oz (63.7 kg)   LMP 10/29/2010   SpO2 97%   BMI 26.52 kg/m   Wt Readings from Last 3 Encounters:  02/16/22 140 lb 6 oz (63.7 kg)  12/30/21 141 lb 3.2 oz (64 kg)  11/13/21 139 lb 8 oz (63.3 kg)      Physical Exam Vitals and nursing note reviewed.  Constitutional:      Appearance: Normal appearance. She is not ill-appearing.  Eyes:     Extraocular Movements: Extraocular movements intact.     Pupils: Pupils are equal, round, and reactive to light.  Cardiovascular:     Rate and Rhythm: Normal rate and regular rhythm.     Pulses: Normal pulses.     Heart sounds: Normal heart sounds. No murmur heard. Pulmonary:     Effort: Pulmonary effort is normal. No respiratory distress.     Breath sounds: Normal breath sounds. No wheezing, rhonchi or rales.  Musculoskeletal:     Right lower leg: No edema.     Left lower leg: No edema.     Comments:  No pain midline spine No reproducible paraspinous mm tenderness Evident levoscoliosis of lumbar spine and dextroscoliosis of thoracic spine Neg SLR bilaterally. No pain with int/ext rotation at hip. Neg FABER. No pain at SIJ, GTB bilaterally.  Discomfort at R sciatic notch  Skin:    General: Skin is warm and dry.     Findings: No rash.  Neurological:     Mental Status: She is alert.     Sensory: Sensation is intact.     Motor: Motor function is intact.     Coordination: Coordination is intact.     Deep Tendon Reflexes:     Reflex Scores:      Patellar reflexes are 1+ on the right side and 1+ on the left side.      Achilles reflexes are 1+ on the right side and 1+ on the left side.    Comments: 5/5 strength BLE  Psychiatric:        Mood and Affect: Mood normal.         Behavior: Behavior normal.       Lab Results  Component Value Date   CREATININE 0.79 10/27/2021   BUN 15 10/27/2021   NA 139 10/27/2021   K 3.9 10/27/2021   CL 104 10/27/2021   CO2 28 10/27/2021   Assessment & Plan:   Problem List Items Addressed This Visit     Essential hypertension - Primary    Chronic, improved readings with recent med changes (from ACEI to ARB). Continue current regimen which she is tolerating well.       Osteoporosis    Tolerated first Reclast well 12/2021.  Discussed rpt DEXA 10/2022.       Lumbar back pain    Chronic R lower back pain present for years, worse with certain physical activity or after prolonged sitting.  Reviewed films from 2021 showing possible bulging disc as well as degenerative facet changes. Possible progression of scoliosis based on exam today - update lumbar films.  Provided with lower back exercises from [facility] pt advisor.  Discussed ortho eval if worsening.       Relevant Orders   DG Lumbar Spine Complete   Scoliosis    Evident on exam - update lumbar films.         No orders of the defined types were placed in this encounter.  Orders Placed This Encounter  Procedures   DG Lumbar Spine Complete    Standing Status:   Future    Number of Occurrences:   1    Standing Expiration Date:   02/17/2023    Order Specific Question:   Reason for Exam (SYMPTOM  OR DIAGNOSIS REQUIRED)    Answer:   chronic R lower back, scoliosis    Order Specific Question:   Preferred imaging location?    Answer:   [place]     Patient Instructions  We will repeat bone density scan 10/2022.  You are doing well today!  Continue current medicines.  For right lower back pain - xrays today. Some scoliosis on exam - likely contributing to discomfort. Lower back stretching/strengthening exercises provided today. If worsening or not improving with this, consider evaluation by [facility] in [place], let us know if you would like a  referral for this.   Follow up plan: Return in about 9 months (around 11/17/2022) for annual exam, prior fasting for blood work, medicare wellness visit.  Ria Bush, MD

## 2022-02-16 NOTE — Assessment & Plan Note (Signed)
Tolerated first Reclast well 12/2021.  Discussed rpt DEXA 10/2022.

## 2022-02-16 NOTE — Assessment & Plan Note (Signed)
Evident on exam - update lumbar films.

## 2022-02-17 LAB — FECAL OCCULT BLOOD, IMMUNOCHEMICAL: Fecal Occult Bld: NEGATIVE

## 2022-02-25 DIAGNOSIS — X32XXXA Exposure to sunlight, initial encounter: Secondary | ICD-10-CM | POA: Diagnosis not present

## 2022-02-25 DIAGNOSIS — L570 Actinic keratosis: Secondary | ICD-10-CM | POA: Diagnosis not present

## 2022-02-25 DIAGNOSIS — D2271 Melanocytic nevi of right lower limb, including hip: Secondary | ICD-10-CM | POA: Diagnosis not present

## 2022-02-25 DIAGNOSIS — D2261 Melanocytic nevi of right upper limb, including shoulder: Secondary | ICD-10-CM | POA: Diagnosis not present

## 2022-02-25 DIAGNOSIS — D225 Melanocytic nevi of trunk: Secondary | ICD-10-CM | POA: Diagnosis not present

## 2022-02-25 DIAGNOSIS — L821 Other seborrheic keratosis: Secondary | ICD-10-CM | POA: Diagnosis not present

## 2022-02-25 DIAGNOSIS — D2272 Melanocytic nevi of left lower limb, including hip: Secondary | ICD-10-CM | POA: Diagnosis not present

## 2022-02-25 DIAGNOSIS — D2262 Melanocytic nevi of left upper limb, including shoulder: Secondary | ICD-10-CM | POA: Diagnosis not present

## 2022-02-25 DIAGNOSIS — C44712 Basal cell carcinoma of skin of right lower limb, including hip: Secondary | ICD-10-CM | POA: Diagnosis not present

## 2022-04-16 DIAGNOSIS — M47816 Spondylosis without myelopathy or radiculopathy, lumbar region: Secondary | ICD-10-CM | POA: Diagnosis not present

## 2022-04-16 DIAGNOSIS — M545 Low back pain, unspecified: Secondary | ICD-10-CM | POA: Diagnosis not present

## 2022-04-16 DIAGNOSIS — M47896 Other spondylosis, lumbar region: Secondary | ICD-10-CM | POA: Diagnosis not present

## 2022-04-27 DIAGNOSIS — M545 Low back pain, unspecified: Secondary | ICD-10-CM | POA: Diagnosis not present

## 2022-06-24 ENCOUNTER — Other Ambulatory Visit: Payer: Self-pay

## 2022-06-24 DIAGNOSIS — I10 Essential (primary) hypertension: Secondary | ICD-10-CM

## 2022-06-24 MED ORDER — AMLODIPINE BESYLATE 5 MG PO TABS: 5.0000 mg | ORAL_TABLET | Freq: Every day | ORAL | 1 refills | Status: DC

## 2022-07-14 ENCOUNTER — Telehealth: Payer: Self-pay | Admitting: Family Medicine

## 2022-07-14 DIAGNOSIS — I10 Essential (primary) hypertension: Secondary | ICD-10-CM

## 2022-07-14 NOTE — Telephone Encounter (Signed)
Prescription Request  07/14/2022  Is this a "Controlled Substance" medicine? No  LOV: 02/16/2022  What is the name of the medication or equipment? valsartan (DIOVAN) 160 MG tablet   Have you contacted your pharmacy to request a refill? Yes   Which pharmacy would you like this sent to?  [facility]  Patient notified that their request is being sent to the clinical staff for review and that they should receive a response within 2 business days.   Please advise at Mobile [phone] (mobile)

## 2022-07-15 MED ORDER — VALSARTAN 160 MG PO TABS: 160.0000 mg | ORAL_TABLET | Freq: Every day | ORAL | 1 refills | Status: DC

## 2022-07-15 NOTE — Telephone Encounter (Signed)
E-scribed refill to [facility].

## 2022-09-20 ENCOUNTER — Telehealth: Payer: Self-pay | Admitting: Family Medicine

## 2022-09-20 DIAGNOSIS — I10 Essential (primary) hypertension: Secondary | ICD-10-CM

## 2022-09-20 MED ORDER — AMLODIPINE BESYLATE 5 MG PO TABS: 5.0000 mg | ORAL_TABLET | Freq: Every day | ORAL | 0 refills | Status: DC

## 2022-09-20 NOTE — Telephone Encounter (Signed)
Spoke to patient by telephone and was advised that she no longer uses [facility]. Patient stated that her new mail order pharmacy is [facility]. Patient stated that she took her last Amlodipine today. Patient requested that a 90 day supply [facility]. Patient stated the next time she needs a refill she will reach out to the office for it to be sent to her mal order pharmacy. Patient stated that she is leaving to go to the beach the end of the week.  [facility] per [facility]'s request.

## 2022-09-20 NOTE — Telephone Encounter (Signed)
Prescription Request  09/20/2022  LOV: 02/16/2022  What is the name of the medication or equipment?  amLODipine (NORVASC) 5 MG tablet  Have you contacted your pharmacy to request a refill? No   Which pharmacy would you like this sent to?  [id number] [address] [place] [address] [place] [address] Phone: [phone] Fax: [phone]     Patient notified that their request is being sent to the clinical staff for review and that they should receive a response within 2 business days.   Please advise at [facility] [phone]

## 2022-09-20 NOTE — Telephone Encounter (Signed)
Prescription Request  09/20/2022  LOV: 02/16/2022  What is the name of the medication or equipment? amLODipine (NORVASC) 5 MG tablet   Have you contacted your pharmacy to request a refill? No   Which pharmacy would you like this sent to?  [id number] [address] [place] [address] [place] [address] Phone: [phone] Fax: [phone]     Patient notified that their request is being sent to the clinical staff for review and that they should receive a response within 2 business days.   Please advise at Mobile [phone] (mobile)

## 2022-10-16 ENCOUNTER — Encounter: Payer: Self-pay | Admitting: Family Medicine

## 2022-10-16 ENCOUNTER — Telehealth: Payer: Self-pay | Admitting: Family Medicine

## 2022-10-16 DIAGNOSIS — M810 Age-related osteoporosis without current pathological fracture: Secondary | ICD-10-CM

## 2022-10-16 NOTE — Telephone Encounter (Addendum)
Will order DEXA to be done at [facility] imaging - please fax to them and notify pt. She can try to schedule this on same day as mammogram  ----- Message from Eustaquio Boyden, MD sent at 02/16/2022 11:14 AM EDT ----- Order Kristen Soto 10/2022

## 2022-10-18 NOTE — Telephone Encounter (Signed)
Lvm asking pt to call back. Need to inform pt Dr. Reece Agar placed order for bone density scan. Pt can call [facility] at [phone] to see about scheduling mammogram and bone density on same day.

## 2022-10-18 NOTE — Telephone Encounter (Signed)
Faxed order to [facility] at [phone]. °

## 2022-10-18 NOTE — Telephone Encounter (Signed)
Printed order and placed in Dr. Timoteo Expose box to sign.

## 2022-10-19 NOTE — Telephone Encounter (Signed)
Lvm asking pt to call back. Need to inform pt Dr. G placed order for bone density scan. Pt can call [facility] at [phone] to see about scheduling mammogram and bone density on same day.  

## 2022-10-20 NOTE — Telephone Encounter (Signed)
Spoke with pt to notify her of bone density order. States she already has it scheduled.

## 2022-11-08 ENCOUNTER — Encounter: Payer: Self-pay | Admitting: Family Medicine

## 2022-11-13 ENCOUNTER — Other Ambulatory Visit: Payer: Self-pay | Admitting: Family Medicine

## 2022-11-13 DIAGNOSIS — E042 Nontoxic multinodular goiter: Secondary | ICD-10-CM

## 2022-11-13 DIAGNOSIS — E785 Hyperlipidemia, unspecified: Secondary | ICD-10-CM

## 2022-11-13 DIAGNOSIS — E559 Vitamin D deficiency, unspecified: Secondary | ICD-10-CM

## 2022-11-15 ENCOUNTER — Other Ambulatory Visit (INDEPENDENT_AMBULATORY_CARE_PROVIDER_SITE_OTHER): Payer: Medicare Other

## 2022-11-15 DIAGNOSIS — E785 Hyperlipidemia, unspecified: Secondary | ICD-10-CM

## 2022-11-15 DIAGNOSIS — E042 Nontoxic multinodular goiter: Secondary | ICD-10-CM

## 2022-11-15 DIAGNOSIS — E559 Vitamin D deficiency, unspecified: Secondary | ICD-10-CM

## 2022-11-15 LAB — COMPREHENSIVE METABOLIC PANEL
ALT: 21 U/L (ref 0–35)
AST: 22 U/L (ref 0–37)
Albumin: 4.4 g/dL (ref 3.5–5.2)
Alkaline Phosphatase: 64 U/L (ref 39–117)
BUN: 13 mg/dL (ref 6–23)
CO2: 27 mEq/L (ref 19–32)
Calcium: 9.2 mg/dL (ref 8.4–10.5)
Chloride: 106 mEq/L (ref 96–112)
Creatinine, Ser: 0.78 mg/dL (ref 0.40–1.20)
GFR: 79.34 mL/min (ref >60.00)
Glucose, Bld: 85 mg/dL (ref 70–99)
Potassium: 3.9 mEq/L (ref 3.5–5.1)
Sodium: 142 mEq/L (ref 135–145)
Total Bilirubin: 0.6 mg/dL (ref 0.2–1.2)
Total Protein: 6.7 g/dL (ref 6.0–8.3)

## 2022-11-15 LAB — VITAMIN D 25 HYDROXY (VIT D DEFICIENCY, FRACTURES): VITD: 47.24 ng/mL (ref 30.00–100.00)

## 2022-11-15 LAB — LIPID PANEL
Cholesterol: 228 mg/dL — ABNORMAL HIGH (ref 0–200)
HDL: 70.2 mg/dL (ref >39.00)
LDL Cholesterol: 136 mg/dL — ABNORMAL HIGH (ref 0–99)
NonHDL: 157.64
Total CHOL/HDL Ratio: 3
Triglycerides: 106 mg/dL (ref 0.0–149.0)
VLDL: 21.2 mg/dL (ref 0.0–40.0)

## 2022-11-15 LAB — TSH: TSH: 1.32 u[IU]/mL (ref 0.35–5.50)

## 2022-11-19 ENCOUNTER — Encounter: Payer: Medicare (Managed Care) | Admitting: Family Medicine

## 2022-11-24 ENCOUNTER — Other Ambulatory Visit: Payer: Self-pay

## 2022-11-24 ENCOUNTER — Telehealth: Payer: Self-pay

## 2022-11-24 ENCOUNTER — Ambulatory Visit (INDEPENDENT_AMBULATORY_CARE_PROVIDER_SITE_OTHER): Payer: Medicare Other | Admitting: Family Medicine

## 2022-11-24 ENCOUNTER — Encounter: Payer: Self-pay | Admitting: Family Medicine

## 2022-11-24 VITALS — BP 134/82 | HR 56 | Temp 97.9°F | Ht 60.75 in | Wt 139.5 lb

## 2022-11-24 DIAGNOSIS — Z853 Personal history of malignant neoplasm of breast: Secondary | ICD-10-CM

## 2022-11-24 DIAGNOSIS — E785 Hyperlipidemia, unspecified: Secondary | ICD-10-CM

## 2022-11-24 DIAGNOSIS — Z1211 Encounter for screening for malignant neoplasm of colon: Secondary | ICD-10-CM

## 2022-11-24 DIAGNOSIS — Z0000 Encounter for general adult medical examination without abnormal findings: Secondary | ICD-10-CM | POA: Diagnosis not present

## 2022-11-24 DIAGNOSIS — M546 Pain in thoracic spine: Secondary | ICD-10-CM

## 2022-11-24 DIAGNOSIS — Z7189 Other specified counseling: Secondary | ICD-10-CM

## 2022-11-24 DIAGNOSIS — M4125 Other idiopathic scoliosis, thoracolumbar region: Secondary | ICD-10-CM

## 2022-11-24 DIAGNOSIS — Z9189 Other specified personal risk factors, not elsewhere classified: Secondary | ICD-10-CM

## 2022-11-24 DIAGNOSIS — M545 Low back pain, unspecified: Secondary | ICD-10-CM

## 2022-11-24 DIAGNOSIS — N393 Stress incontinence (female) (male): Secondary | ICD-10-CM

## 2022-11-24 DIAGNOSIS — Z13220 Encounter for screening for lipoid disorders: Secondary | ICD-10-CM

## 2022-11-24 DIAGNOSIS — E559 Vitamin D deficiency, unspecified: Secondary | ICD-10-CM

## 2022-11-24 DIAGNOSIS — M810 Age-related osteoporosis without current pathological fracture: Secondary | ICD-10-CM

## 2022-11-24 DIAGNOSIS — R109 Unspecified abdominal pain: Secondary | ICD-10-CM

## 2022-11-24 DIAGNOSIS — G8929 Other chronic pain: Secondary | ICD-10-CM

## 2022-11-24 DIAGNOSIS — I10 Essential (primary) hypertension: Secondary | ICD-10-CM

## 2022-11-24 LAB — POC URINALSYSI DIPSTICK (AUTOMATED)
Bilirubin, UA: NEGATIVE
Glucose, UA: NEGATIVE
Ketones, UA: NEGATIVE
Leukocytes, UA: NEGATIVE
Nitrite, UA: NEGATIVE
Protein, UA: NEGATIVE
Spec Grav, UA: 1.01 (ref 1.010–1.025)
Urobilinogen, UA: 0.2 E.U./dL
pH, UA: 6.5 (ref 5.0–8.0)

## 2022-11-24 MED ORDER — NA SULFATE-K SULFATE-MG SULF 17.5-3.13-1.6 GM/177ML PO SOLN: 1.0000 | Freq: Once | ORAL | 0 refills | Status: AC

## 2022-11-24 MED ORDER — VALSARTAN 160 MG PO TABS: 160.0000 mg | ORAL_TABLET | Freq: Every day | ORAL | 4 refills | Status: DC

## 2022-11-24 MED ORDER — AMLODIPINE BESYLATE 5 MG PO TABS: 5.0000 mg | ORAL_TABLET | Freq: Every day | ORAL | 4 refills | Status: DC

## 2022-11-24 NOTE — Assessment & Plan Note (Signed)
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  Check UA today, abd [place] to evaluate liver and R kidney ensure no cause for pain there.

## 2022-11-24 NOTE — Telephone Encounter (Signed)
Gastroenterology Pre-Procedure Review  Request Date: 01/18/23 Requesting Physician: Dr. Tobi Bastos  PATIENT REVIEW QUESTIONS: The patient responded to the following health history questions as indicated:    1. Are you having any GI issues? no 2. Do you have a personal history of Polyps? no 3. Do you have a family history of Colon Cancer or Polyps? no 4. Diabetes Mellitus? no 5. Joint replacements in the past 12 months?no 6. Major health problems in the past 3 months?no 7. Any artificial heart valves, MVP, or defibrillator?no    MEDICATIONS & ALLERGIES:    Patient reports the following regarding taking any anticoagulation/antiplatelet therapy:   Plavix, Coumadin, Eliquis, Xarelto, Lovenox, Pradaxa, Brilinta, or Effient? no Aspirin? no  Patient confirms/reports the following medications:  Current Outpatient Medications  Medication Sig Dispense Refill   Na Sulfate-K Sulfate-Mg Sulf 17.5-3.13-1.6 GM/177ML SOLN Take 1 kit by mouth once for 1 dose. 354 mL 0   amLODipine (NORVASC) 5 MG tablet Take 1 tablet (5 mg total) by mouth daily. 90 tablet 4   Calcium Carb-Cholecalciferol (CALCIUM-VITAMIN D) 600-400 MG-UNIT TABS Take 1 tablet by mouth daily. 60 tablet    Multiple Vitamin (MULTIVITAMIN) tablet Take 1 tablet by mouth daily.     Omega-3 Fatty Acids (FISH OIL PO) Take by mouth daily.     valsartan (DIOVAN) 160 MG tablet Take 1 tablet (160 mg total) by mouth daily. 90 tablet 4   zoledronic acid (RECLAST) 5 MG/100ML SOLN injection Inject 100 mLs (5 mg total) into the vein. Repeat yearly (Patient not taking: Reported on 11/24/2022)     No current facility-administered medications for this visit.    Patient confirms/reports the following allergies:  Allergies  Allergen Reactions   Hctz [Hydrochlorothiazide]     Dysuria, frequency, UTIs   Lisinopril Cough    Cough    No orders of the defined types were placed in this encounter.   AUTHORIZATION INFORMATION Primary Insurance: 1D#: Group  #:  Secondary Insurance: 1D#: Group #:  SCHEDULE INFORMATION: Date: 01/18/23 Time: Location: [facility]

## 2022-11-24 NOTE — Assessment & Plan Note (Signed)
Previously discussed.

## 2022-11-24 NOTE — Assessment & Plan Note (Signed)
Chronic, predominantly on right side near flank.  Lumbar xrays showed DDD, scoliosis.  Saw orthopedist s/p MRI which pt states also showed arthritis changes.

## 2022-11-24 NOTE — Assessment & Plan Note (Signed)
Noted on prior xrays. This could contribute to chronic back pain.

## 2022-11-24 NOTE — Assessment & Plan Note (Signed)
Preventative protocols reviewed and updated unless pt declined. Discussed healthy diet and lifestyle.  

## 2022-11-24 NOTE — Progress Notes (Signed)
Ph: [phone] Fax: [phone]   Patient ID: Kristen Soto, female    DOB: 21-May-1956, 67 y.o.   MRN: [id number]  This visit was conducted in person.  BP 134/82   Pulse (!) 56   Temp 97.9 F (36.6 C) (Temporal)   Ht 5' 0.75" (1.543 m)   Wt 139 lb 8 oz (63.3 kg)   LMP 10/29/2010   SpO2 98%   BMI 26.58 kg/m    CC: CPE/AMW Subjective:   HPI: Kristen Soto is a 67 y.o. female presenting on 11/24/2022 for Medicare Wellness   Upcoming trip to [facility]. Wants to dive, brings form to fill out.  Did not see health advisor  this year.  Hearing Screening   500Hz  1000Hz  2000Hz  4000Hz   Right ear 20 20 20  0  Left ear 25 40 25 0  Vision Screening - Comments:: Last eye exam, Feb/Mar 2024.  Flowsheet Row Office Visit from 11/24/2022 in [facility]  PHQ-2 Total Score 0          11/24/2022   12:21 PM 11/13/2021    9:49 AM 03/16/2017   10:45 AM  Fall Risk   Falls in the past year? 0  No  Number falls in past yr:  0   Injury with Fall?  0    Did have COVID infection 08/2022 - symptoms fully resolved.   Chronic low back pain with R sided flank discomfort, ongoing for years. Has seen [facility] for this s/p lumbar MRI told had arthritis. More recently noticing some sciatica pain down right lateral thigh into lower leg. No inciting trauma injury or fall. No urinary symptoms. No numbness or weakness of legs, no saddle anesthesia, no bowel/bladder incontinence, no fevers.   Preventative: Colon cancer screening - normal colonoscopy at [facility] (I can't see report) 12/2007. Previously yearly stool kits. Last normal 02/2022. requests colonoscopy this year.  H/o breast cancer ~2006 s/p remote chemotherapy as well as tamoxifen then Evista x5 yrs - yearly mammograms at [facility]), latest mammo 10/2021 Birads2.  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 Guidelines recommend yearly pelvic exam/pap smear of cervix and vagina and visualization of  entire vagina due to increased risk of vaginal or cervical clear cell adenocarcinoma. Established with GYN Dr Wilhelmenia Blase s/p normal pap 11/2020 - ok to stop cervical cancer screening.  DEXA 10/2020 - T -2.7 spine, -1.2 L femur at [facility] h/o osteoporosis - previously on weekly fosamax then monthly boniva and regular calcium, vit D, weight bearing exercise. Previous prolia became too expensive (took for about 1 year). Reclast started last year, first dose 12/30/2021.  Lung cancer screening - not eligible  Flu shot - declines  COVID vaccine - discussed, declines  Prevnar-20 11/2021 Td 2003, Tdap 2014  Shingrix - 2018, 2019 Advanced directive discussion - scanned 09/2020. Husband Mikel Cella and son Delice Lesch are HCPOA. Does not want prolonged life support if terminal condition. Does want artificial hydration/nutrition continued. HCPOA may override advanced directive.  Seat belt use discussed Sunscreen use discussed. No changing moles on skin. Sees derm regularly.  Ex smoker, quit 30 yrs ago.  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 Interim medical history since our last visit reviewed. Allergies and medications reviewed and updated. Outpatient Medications Prior to Visit  Medication Sig Dispense Refill   Calcium Carb-Cholecalciferol (CALCIUM-VITAMIN D) 600-400 MG-UNIT TABS Take 1 tablet by mouth daily. 60 tablet    Multiple Vitamin (MULTIVITAMIN) tablet Take 1 tablet by mouth daily.     Omega-3 Fatty Acids (FISH OIL PO) Take by mouth daily.     zoledronic acid (RECLAST) 5 MG/100ML SOLN injection Inject 100 mLs (5 mg total) into  the vein. Repeat yearly (Patient not taking: Reported on 11/24/2022)     amLODipine (NORVASC) 5 MG tablet Take 1 tablet (5 mg total) by mouth daily. 90 tablet 0   valsartan (DIOVAN) 160 MG tablet Take 1 tablet (160 mg total) by mouth daily. 90 tablet 1   No facility-administered medications prior to visit.     Per HPI unless specifically indicated in ROS section below Review of Systems  Constitutional:  Negative for activity change, appetite change, chills, fatigue, fever and unexpected weight change.  HENT:  Negative for hearing loss.   Eyes:  Negative for visual disturbance.  Respiratory:  Negative for cough, chest tightness, shortness of breath and wheezing.   Cardiovascular:  Negative for chest pain, palpitations and leg swelling.  Gastrointestinal:  Negative for abdominal distention, abdominal pain, blood in stool, constipation, diarrhea, nausea and vomiting.  Genitourinary:  Negative for difficulty urinating and hematuria.  Musculoskeletal:  Negative for arthralgias, myalgias and neck pain.  Skin:  Negative for rash.  Neurological:  Negative for dizziness, seizures, syncope and headaches.  Hematological:  Negative for adenopathy. Does not bruise/bleed easily.  Psychiatric/Behavioral:  Negative for dysphoric mood. The patient is not nervous/anxious.     Objective:  BP 134/82   Pulse (!) 56   Temp 97.9 F (36.6 C) (Temporal)   Ht 5' 0.75" (1.543 m)   Wt 139 lb 8 oz (63.3 kg)   LMP 10/29/2010   SpO2 98%   BMI 26.58 kg/m   Wt Readings from Last 3 Encounters:  11/24/22 139 lb 8 oz (63.3 kg)  02/16/22 140 lb 6 oz (63.7 kg)  12/30/21 141 lb 3.2 oz (64 kg)      Physical Exam Vitals and nursing note reviewed.  Constitutional:      Appearance: Normal appearance. She is not ill-appearing.  HENT:     Head: Normocephalic and atraumatic.     Right Ear: Tympanic membrane, ear canal and external ear normal. There is no impacted cerumen.     Left Ear: Ear canal and external ear  normal. There is impacted cerumen.     Mouth/Throat:     Mouth: Mucous membranes are moist.     Pharynx: Oropharynx is clear. No oropharyngeal exudate or posterior oropharyngeal erythema.  Eyes:     General:        Right eye: No discharge.        Left eye: No discharge.     Extraocular Movements: Extraocular movements intact.     Conjunctiva/sclera: Conjunctivae normal.     Pupils: Pupils are equal, round, and reactive to light.  Neck:     Thyroid: No thyroid mass or thyromegaly.     Vascular: No carotid bruit.  Cardiovascular:     Rate and Rhythm: Normal rate and regular rhythm.     Pulses: Normal pulses.     Heart sounds: Normal heart  sounds. No murmur heard. Pulmonary:     Effort: Pulmonary effort is normal. No respiratory distress.     Breath sounds: Normal breath sounds. No wheezing, rhonchi or rales.  Abdominal:     General: Bowel sounds are normal. There is no distension.     Palpations: Abdomen is soft. There is no mass.     Tenderness: There is no abdominal tenderness. There is no right CVA tenderness, left CVA tenderness, guarding or rebound. Negative signs include Murphy's sign.     Hernia: No hernia is present.       Comments: R flank, posterior ribcage discomfort to palpation   Musculoskeletal:     Cervical back: Normal range of motion and neck supple. No rigidity.     Right lower leg: No edema.     Left lower leg: No edema.     Comments:  No pain midline spine No paraspinous mm tenderness Neg SLR bilaterally. No pain with int/ext rotation at hip. Neg FABER. No pain at SIJ, GTB or sciatic notch bilaterally.   Lymphadenopathy:     Cervical: No cervical adenopathy.  Skin:    General: Skin is warm and dry.     Findings: No rash.     Comments: Healing area to anterior chest wall at site of recent 5-FU application  Neurological:     General: No focal deficit present.     Mental Status: She is alert. Mental status is at baseline.     Comments:  Recall  3/3 Calculation 5/5 DLROW  Psychiatric:        Mood and Affect: Mood normal.        Behavior: Behavior normal.       Results for orders placed or performed in visit on 11/24/22  POCT Urinalysis Dipstick (Automated)  Result Value Ref Range   Color, UA light yellow    Clarity, UA clear    Glucose, UA Negative Negative   Bilirubin, UA negative    Ketones, UA negative    Spec Grav, UA 1.010 1.010 - 1.025   Blood, UA +/-    pH, UA 6.5 5.0 - 8.0   Protein, UA Negative Negative   Urobilinogen, UA 0.2 0.2 or 1.0 E.U./dL   Nitrite, UA negative    Leukocytes, UA Negative Negative    Assessment & Plan:   Problem List Items Addressed This Visit     Vitamin D deficiency    Continue cal/vit D      Essential hypertension    Chronic, stable continue current medicines.      Relevant Medications   valsartan (DIOVAN) 160 MG tablet   amLODipine (NORVASC) 5 MG tablet   BREAST CANCER, HX OF    Continues yearly mammo through [facility] - due for this.       Health maintenance examination (Chronic)    Preventative protocols reviewed and updated unless pt declined. Discussed healthy diet and lifestyle.       Osteoporosis    Pending rpt DEXA She's been on bisphosphonate for about 5 yrs. Previously took SERM followed by prolia for a year.  Reviewed risks of atypical fractures on prolonged bisphosphonate use - will await DEXA then decide on continued need for Reclast.  Continue cal, vit D, regular weight bearing exercise.       Thoracolumbar back pain    Chronic, predominantly on right side near flank.  Lumbar xrays showed DDD, scoliosis.  Saw orthopedist s/p MRI which pt states also showed arthritis changes.  Hyperlipidemia    Chronic, stable period off medication. Continue working on diet changes to lower cholesterol.  The 10-year ASCVD risk score (Arnett DK, et al., 2019) is: 8.8%   Values used to calculate the score:     Age: 68 years     Sex: Female     Is Non-Hispanic  African American: No     Diabetic: No     Tobacco smoker: No     Systolic Blood Pressure: 134 mmHg     Is BP treated: Yes     HDL Cholesterol: 70.2 mg/dL     Total Cholesterol: 228 mg/dL       Relevant Medications   valsartan (DIOVAN) 160 MG tablet   amLODipine (NORVASC) 5 MG tablet   Chronic right flank pain    Longstanding, likely related to thoracolumbar DDD.  Check UA today, abd [place] to evaluate liver and R kidney ensure no cause for pain there.       Relevant Orders   POCT Urinalysis Dipstick (Automated) (Completed)   US Abdomen Complete   Advanced directives, counseling/discussion (Chronic)    Previously discussed      Medicare annual wellness visit, initial - Primary (Chronic)    I have personally reviewed the Medicare Annual Wellness questionnaire and have noted 1. The patient's medical and social history 2. Their use of alcohol, tobacco or illicit drugs 3. Their current medications and supplements 4. The patient's functional ability including ADL's, fall risks, home safety risks and hearing or visual impairment. Cognitive function has been assessed and addressed as indicated.  5. Diet and physical activity 6. Evidence for depression or mood disorders The patients weight, height, BMI have been recorded in the chart. I have made referrals, counseling and provided education to the patient based on review of the above and I have provided the pt with a written personalized care plan for preventive services. Provider list updated.. See scanned questionairre as needed for further documentation. Reviewed preventative protocols and updated unless pt declined.       Stress incontinence    Mild. Recommend kegel exercises      Scoliosis    Noted on prior xrays. This could contribute to chronic back pain.       Other Visit Diagnoses     Special screening for malignant neoplasms, colon       Relevant Orders   Ambulatory referral to Gastroenterology   Right lateral  abdominal pain       Relevant Orders   US Abdomen Complete        Meds ordered this encounter  Medications   valsartan (DIOVAN) 160 MG tablet    Sig: Take 1 tablet (160 mg total) by mouth daily.    Dispense:  90 tablet    Refill:  4   amLODipine (NORVASC) 5 MG tablet    Sig: Take 1 tablet (5 mg total) by mouth daily.    Dispense:  90 tablet    Refill:  4    Orders Placed This Encounter  Procedures   US Abdomen Complete    Standing Status:   Future    Standing Expiration Date:   11/24/2023    Order Specific Question:   Reason for Exam (SYMPTOM  OR DIAGNOSIS REQUIRED)    Answer:   R sided abd pain, R flank pain eval kidneys and liver    Order Specific Question:   Preferred imaging location?    Answer:   [facility]   Ambulatory referral to Gastroenterology  Referral Priority:   Routine    Referral Type:   Consultation    Referral Reason:   Specialty Services Required    Number of Visits Requested:   1   POCT Urinalysis Dipstick (Automated)    Patient Instructions  We will refer you for colonoscopy at [place].  Will await DEXA scan results next week, then decide on ongoing need for Reclast.  Abdominal ultrasound, urinalysis today for evaluation of right flank pain/R sided abd pain.  You are doing well today Return as needed or in 1 year for next physical.   Follow up plan: Return in about 1 year (around 11/24/2023) for annual exam, prior fasting for blood work, medicare wellness visit.  Eustaquio Boyden, MD

## 2022-11-24 NOTE — Assessment & Plan Note (Signed)
Chronic, stable continue current medicines.

## 2022-11-24 NOTE — Assessment & Plan Note (Signed)
Chronic, stable period off medication. Continue working on diet changes to lower cholesterol.  The 10-year ASCVD risk score (Arnett DK, et al., 2019) is: 8.8%   Values used to calculate the score:     Age: 67 years     Sex: Female     Is Non-Hispanic African American: No     Diabetic: No     Tobacco smoker: No     Systolic Blood Pressure: 134 mmHg     Is BP treated: Yes     HDL Cholesterol: 70.2 mg/dL     Total Cholesterol: 228 mg/dL

## 2022-11-24 NOTE — Assessment & Plan Note (Signed)

## 2022-11-24 NOTE — Assessment & Plan Note (Addendum)
Continues yearly mammo through [facility] - due for this.

## 2022-11-24 NOTE — Assessment & Plan Note (Signed)
Pending rpt DEXA She's been on bisphosphonate for about 5 yrs. Previously took SERM followed by prolia for a year.  Reviewed risks of atypical fractures on prolonged bisphosphonate use - will await DEXA then decide on continued need for Reclast.  Continue cal, vit D, regular weight bearing exercise.

## 2022-11-24 NOTE — Assessment & Plan Note (Signed)
Continue cal/vit D.  

## 2022-11-24 NOTE — Patient Instructions (Addendum)
We will refer you for colonoscopy at [facility].  Will await DEXA scan results next week, then decide on ongoing need for Reclast.  Abdominal ultrasound, urinalysis today for evaluation of right flank pain/R sided abd pain.  You are doing well today Return as needed or in 1 year for next physical.

## 2022-11-24 NOTE — Assessment & Plan Note (Signed)
Mild. Recommend kegel exercises

## 2022-11-30 ENCOUNTER — Encounter: Payer: Self-pay | Admitting: *Deleted

## 2022-12-01 ENCOUNTER — Encounter: Payer: Self-pay | Admitting: Family Medicine

## 2022-12-01 DIAGNOSIS — R928 Other abnormal and inconclusive findings on diagnostic imaging of breast: Secondary | ICD-10-CM

## 2022-12-01 LAB — HM MAMMOGRAPHY

## 2022-12-01 LAB — HM DEXA SCAN

## 2022-12-04 ENCOUNTER — Other Ambulatory Visit: Payer: Self-pay | Admitting: Family Medicine

## 2022-12-04 DIAGNOSIS — M810 Age-related osteoporosis without current pathological fracture: Secondary | ICD-10-CM

## 2022-12-04 NOTE — Progress Notes (Signed)
Ordering Reclast IV infusion for osteoporosis Last infusion was 12/30/2021.

## 2022-12-07 ENCOUNTER — Ambulatory Visit (INDEPENDENT_AMBULATORY_CARE_PROVIDER_SITE_OTHER): Payer: Medicare Other | Admitting: Primary Care

## 2022-12-07 ENCOUNTER — Encounter: Payer: Self-pay | Admitting: Primary Care

## 2022-12-07 VITALS — BP 134/82 | HR 75 | Temp 97.7°F | Ht 60.75 in | Wt 139.0 lb

## 2022-12-07 DIAGNOSIS — H6122 Impacted cerumen, left ear: Secondary | ICD-10-CM | POA: Diagnosis not present

## 2022-12-07 DIAGNOSIS — H6120 Impacted cerumen, unspecified ear: Secondary | ICD-10-CM | POA: Insufficient documentation

## 2022-12-07 DIAGNOSIS — H9202 Otalgia, left ear: Secondary | ICD-10-CM | POA: Diagnosis not present

## 2022-12-07 NOTE — Assessment & Plan Note (Signed)
Most likely secondary to deep cerumen impaction for which was removed today. No evidence of infection.  She will update if symptoms do not continue to improve post irrigation. Discussed home use of Debrox drops.

## 2022-12-07 NOTE — Progress Notes (Signed)
Subjective:    Patient ID: Kristen Soto, female    DOB: 1956/05/30, 67 y.o.   MRN: [id number]  HPI  Kristen Soto is a very pleasant 67 y.o. female patient of Dr. Sharen Hones with a history of allergic rhinitis, breast cancer, tinnitus who presents today to discuss otalgia.  Symptom onset 2 days ago after scuba diving.  She was scuba diving in a local pool for practice and began noticing ear pain and tinnitus later that evening.  She began using Debrox drops and didn't notice any wax removed. She did try spraying her left ear with a sprayer without improvement. She's concerned for an ear infection as she will be leaving for a cruise in a few days. She will also be on a scuba diving trip in late June.   Review of Systems  Constitutional:  Negative for chills and fever.  HENT:  Positive for ear pain and tinnitus. Negative for postnasal drip and sore throat.   Respiratory:  Negative for cough.          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    Hypertension    Osteoporosis     Social History   Socioeconomic History   Marital status: Married    Spouse name: Not on file   Number of children: 1   Years of education: Not on file   Highest education level: Not on file  Occupational History   Occupation: PROJECT MANAGEN    Employer: [employer]    Comment: retired  Tobacco Use   Smoking status: Former   Smokeless tobacco: Never  Building services engineer Use: Never used  Substance and Sexual Activity   Alcohol use: Yes    Alcohol/week: 0.0 standard drinks of alcohol    Comment: wine-occ   Drug use: No   Sexual activity: Yes    Birth control/protection:  Post-menopausal  Other Topics Concern   Not on file  Social History Narrative   Lives with husband Kristen Soto: retired 2015, was Doctor, hospital for [employer]   Act: no regular exercise   Diet: good water, fruits/vegetables daily   Social Determinants of Corporate investment banker Strain: Not on file  Food Insecurity: Not on file  Transportation Needs: Not on file  Physical Activity: Not on file  Stress: Not on file  Social Connections: Not on file  Intimate Partner Violence: Not on file    Past Surgical History:  Procedure Laterality Date   BASAL CELL CARCINOMA EXCISION  05/2008   removal, left eyelid   BREAST LUMPECTOMY     CYSTOSCOPY  02/02/2000   mild urethral trigonitis   ELBOW SURGERY Right 2019    Family History  Problem Relation Age of Onset   Heart attack Paternal Grandfather 35   Kidney disease Paternal Grandmother        dialysis related   Hypertension Paternal Grandmother    Cancer Maternal Grandmother 76       breast   Heart disease Maternal Grandfather        pacemaker   Heart attack Father 21   Hypertension Father    COPD Father  Alcohol abuse Mother    Hypertension Mother    Luiz Blare' disease Mother    Hypertension Sister    Cancer Sister 71       breast    Allergies  Allergen Reactions   Hctz [Hydrochlorothiazide]     Dysuria, frequency, UTIs   Lisinopril Cough    Cough    Current Outpatient Medications on File Prior to Visit  Medication Sig Dispense Refill   amLODipine (NORVASC) 5 MG tablet Take 1 tablet (5 mg total) by mouth daily. 90 tablet 4   Calcium Carb-Cholecalciferol (CALCIUM-VITAMIN D) 600-400 MG-UNIT TABS Take 1 tablet by mouth daily. 60 tablet    Multiple Vitamin (MULTIVITAMIN) tablet Take 1 tablet by mouth daily.     Omega-3 Fatty Acids (FISH OIL PO) Take by mouth daily.     valsartan (DIOVAN) 160 MG tablet Take 1 tablet (160 mg total) by mouth daily. 90 tablet 4   zoledronic acid (RECLAST) 5 MG/100ML SOLN injection Inject  100 mLs (5 mg total) into the vein. Repeat yearly (Patient not taking: Reported on 11/24/2022)     No current facility-administered medications on file prior to visit.    BP 134/82   Pulse 75   Temp 97.7 F (36.5 C) (Temporal)   Ht 5' 0.75" (1.543 m)   Wt 139 lb (63 kg)   LMP 10/29/2010   SpO2 98%   BMI 26.48 kg/m  Objective:   Physical Exam Constitutional:      General: She is not in acute distress. HENT:     Right Ear: No drainage. There is no impacted cerumen. Tympanic membrane is not erythematous or bulging.     Left Ear: No drainage. There is impacted cerumen. Tympanic membrane is not erythematous or bulging.  Pulmonary:     Effort: Pulmonary effort is normal.  Neurological:     Mental Status: She is alert.           Assessment & Plan:  Impacted cerumen of left ear Assessment & Plan: Left cerumen impaction identified on exam. Patient consented to irrigation of canals bilaterally.  Left canal irrigated. Patient tolerated well. TM's and canals post irrigation unremarkable.   Discussed home care instructions.     Otalgia of left ear Assessment & Plan: Most likely secondary to deep cerumen impaction for which was removed today. No evidence of infection.  She will update if symptoms do not continue to improve post irrigation. Discussed home use of Debrox drops.         Doreene Nest, NP

## 2022-12-07 NOTE — Assessment & Plan Note (Signed)
Left cerumen impaction identified on exam. Patient consented to irrigation of canals bilaterally.  Left canal irrigated. Patient tolerated well. TM's and canals post irrigation unremarkable.   Discussed home care instructions.   

## 2022-12-07 NOTE — Patient Instructions (Signed)
Use the Debrox drops in the future for an ear wax impaction.  Please call us if your pain does not resolve.   It was a pleasure meeting you!

## 2022-12-08 NOTE — Addendum Note (Signed)
Addended by: Eustaquio Boyden on: 12/08/2022 05:21 PM   Modules accepted: Orders

## 2022-12-08 NOTE — Addendum Note (Signed)
Addended by: Eustaquio Boyden on: 12/08/2022 09:07 AM   Modules accepted: Orders

## 2022-12-08 NOTE — Telephone Encounter (Signed)
Ok to proceed with Prolia for July.

## 2022-12-08 NOTE — Progress Notes (Signed)
Actually pt declined Reclast, wants to do Prolia - cancelled reclast infusion order.

## 2022-12-10 ENCOUNTER — Telehealth: Payer: Self-pay | Admitting: Pharmacy Technician

## 2022-12-10 NOTE — Telephone Encounter (Signed)
-----   Message from Donnamarie Poag, [place] sent at 12/09/2022  2:57 PM EDT ----- Please run benefits for patient

## 2022-12-10 NOTE — Telephone Encounter (Signed)
Prolia has been added to [employer] Message has been sent for authorization with insurance.

## 2022-12-13 ENCOUNTER — Other Ambulatory Visit (HOSPITAL_COMMUNITY): Payer: Self-pay

## 2022-12-13 NOTE — Telephone Encounter (Signed)
Prolia VOB initiated via [email or web address]  Patient Product/process development scientist completed.    The current 180 day co-pay is, $375.00.   The patient is insured through [employer]

## 2022-12-15 ENCOUNTER — Other Ambulatory Visit (HOSPITAL_COMMUNITY): Payer: Self-pay

## 2022-12-17 ENCOUNTER — Encounter: Payer: Self-pay | Admitting: *Deleted

## 2022-12-24 NOTE — Telephone Encounter (Signed)
Called insurance to verify benefits:  15% coinsurance, no admin fee, PA required

## 2022-12-31 NOTE — Telephone Encounter (Signed)
PA pending

## 2023-01-07 ENCOUNTER — Other Ambulatory Visit (HOSPITAL_COMMUNITY): Payer: Self-pay

## 2023-01-07 NOTE — Telephone Encounter (Signed)
Pt ready for scheduling for prolia on or after : 01/07/23  Out-of-pocket cost due at time of visit: $225  Primary: UHC-MEDICARE Prolia co-insurance: 15% Admin fee co-insurance: 0%  Secondary: --- Prolia co-insurance:  Admin fee co-insurance:   Medical Benefit Details: Date Benefits were checked: 12/24/22 Deductible: NO/ Coinsurance: 15%/ Admin Fee: 0%  Prior Auth: APPROVED PA# [id number]  Expiration Date: 12/31/22-12/31/23   Pharmacy benefit: Copay $375 If patient wants fill through the pharmacy benefit please send prescription to: [facility], and include estimated need by date in rx notes. Pharmacy will ship medication directly to the office.  Patient NOT eligible for Prolia Copay Card. Copay Card can make patient's cost as little as $25. Link to apply: [email or web address]  ** This summary of benefits is an estimation of the patient's out-of-pocket cost. Exact cost may very based on individual plan coverage.

## 2023-01-07 NOTE — Telephone Encounter (Signed)
Authorization Number: [id number] 12/31/22-12/31/23

## 2023-01-10 NOTE — Telephone Encounter (Signed)
Call can not be completed at this time.  °

## 2023-01-16 ENCOUNTER — Encounter: Payer: Self-pay | Admitting: Family Medicine

## 2023-01-17 ENCOUNTER — Encounter: Payer: Self-pay | Admitting: Gastroenterology

## 2023-01-17 ENCOUNTER — Ambulatory Visit
Admission: RE | Admit: 2023-01-17 | Discharge: 2023-01-17 | Disposition: A | Discharge status: Home / Self Care | Payer: Self-pay | Source: Ambulatory Visit | Attending: Family Medicine | Admitting: Family Medicine

## 2023-01-17 DIAGNOSIS — Z9189 Other specified personal risk factors, not elsewhere classified: Secondary | ICD-10-CM | POA: Insufficient documentation

## 2023-01-17 DIAGNOSIS — Z13220 Encounter for screening for lipoid disorders: Secondary | ICD-10-CM | POA: Insufficient documentation

## 2023-01-17 DIAGNOSIS — E785 Hyperlipidemia, unspecified: Secondary | ICD-10-CM | POA: Insufficient documentation

## 2023-01-17 DIAGNOSIS — Z136 Encounter for screening for cardiovascular disorders: Secondary | ICD-10-CM | POA: Insufficient documentation

## 2023-01-18 ENCOUNTER — Encounter: Payer: Self-pay | Admitting: Gastroenterology

## 2023-01-18 ENCOUNTER — Encounter
Admission: RE | Disposition: A | Discharge status: Home / Self Care | Payer: Self-pay | Source: Home / Self Care | Attending: Gastroenterology

## 2023-01-18 ENCOUNTER — Ambulatory Visit: Payer: Medicare Other | Admitting: Anesthesiology

## 2023-01-18 ENCOUNTER — Ambulatory Visit
Admission: RE | Admit: 2023-01-18 | Discharge: 2023-01-18 | Disposition: A | Discharge status: Home / Self Care | Payer: Medicare Other | Attending: Gastroenterology | Admitting: Gastroenterology

## 2023-01-18 DIAGNOSIS — I10 Essential (primary) hypertension: Secondary | ICD-10-CM | POA: Diagnosis not present

## 2023-01-18 DIAGNOSIS — Z1211 Encounter for screening for malignant neoplasm of colon: Secondary | ICD-10-CM | POA: Diagnosis present

## 2023-01-18 HISTORY — PX: COLONOSCOPY WITH PROPOFOL: SHX5780

## 2023-01-18 SURGERY — COLONOSCOPY WITH PROPOFOL
Anesthesia: General

## 2023-01-18 MED ORDER — PROPOFOL 10 MG/ML IV BOLUS
INTRAVENOUS | Status: DC | PRN
  Administered 2023-01-18: 180 ug/kg/min via INTRAVENOUS
  Administered 2023-01-18: 100 mg via INTRAVENOUS

## 2023-01-18 MED ORDER — SODIUM CHLORIDE 0.9 % IV SOLN
INTRAVENOUS | Status: DC
  Administered 2023-01-18: 1000 mL via INTRAVENOUS

## 2023-01-18 MED ORDER — LIDOCAINE HCL (CARDIAC) PF 100 MG/5ML IV SOSY
PREFILLED_SYRINGE | INTRAVENOUS | Status: DC | PRN
  Administered 2023-01-18: 100 mg via INTRAVENOUS

## 2023-01-18 MED ORDER — PROPOFOL 10 MG/ML IV BOLUS
INTRAVENOUS | Status: AC
  Filled 2023-01-18: qty 20

## 2023-01-18 MED ORDER — LIDOCAINE HCL (PF) 2 % IJ SOLN
INTRAMUSCULAR | Status: AC
  Filled 2023-01-18: qty 5

## 2023-01-18 NOTE — H&P (Signed)
Wyline Mood, MD [address], [address], [place], [place], [address] [address], [place], [place], [address] Phone: [phone]  Fax: [phone]  Primary Care Physician:  Eustaquio Boyden, MD   Pre-Procedure History & Physical: HPI:  Kristen Soto is a 67 y.o. female is here for an colonoscopy.   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    Hypertension    Osteoporosis     Past Surgical History:  Procedure Laterality Date   BASAL CELL CARCINOMA EXCISION  05/2008   removal, left eyelid   BREAST LUMPECTOMY     CYSTOSCOPY  02/02/2000   mild urethral trigonitis   ELBOW SURGERY Right 2019    Prior to Admission medications   Medication Sig Start Date End Date Taking? Authorizing Provider  amLODipine (NORVASC) 5 MG tablet Take 1 tablet (5 mg total) by mouth daily. 11/24/22   Eustaquio Boyden, MD  Calcium Carb-Cholecalciferol (CALCIUM-VITAMIN D) 600-400 MG-UNIT TABS Take 1 tablet by mouth daily. 07/02/19   Eustaquio Boyden, MD  Multiple Vitamin (MULTIVITAMIN) tablet Take 1 tablet by mouth daily.    [provider]  Omega-3 Fatty Acids (FISH OIL PO) Take by mouth daily.    [provider]  valsartan (DIOVAN) 160 MG tablet Take 1 tablet (160 mg total) by mouth daily. 11/24/22   Eustaquio Boyden, MD  zoledronic acid (RECLAST) 5 MG/100ML SOLN injection Inject 100 mLs (5 mg total) into the vein. Repeat yearly Patient not taking: Reported on 11/24/2022 12/08/21   Eustaquio Boyden, MD    Allergies as of 11/24/2022 - Review Complete 11/24/2022  Allergen Reaction Noted   Hctz [hydrochlorothiazide]  02/13/2013   Lisinopril  Cough 06/14/2011    Family History  Problem Relation Age of Onset   Heart attack Paternal Grandfather 91   Kidney disease Paternal Grandmother        dialysis related   Hypertension Paternal Grandmother    Cancer Maternal Grandmother 39       breast   Heart disease Maternal Grandfather        pacemaker   Heart attack Father 55   Hypertension Father    COPD Father    Alcohol abuse Mother    Hypertension Mother    Luiz Blare' disease Mother    Hypertension Sister    Cancer Sister 46       breast    Social History   Socioeconomic History   Marital status: Married    Spouse name: Not on file   Number of children: 1   Years of education: Not on file   Highest education level: Not on file  Occupational History   Occupation: PROJECT MANAGEN    Employer: [employer]    Comment: retired  Tobacco Use   Smoking status: Former   Smokeless tobacco: Never  Advertising account planner   Vaping status: Never Used  Substance and Sexual Activity   Alcohol use: Yes    Alcohol/week: 0.0 standard drinks of alcohol    Comment: wine-occ   Drug use: No   Sexual activity: Yes    Birth control/protection: Post-menopausal  Other Topics Concern   Not on file  Social History  Narrative   Lives with husband [name]: retired 2015, was Doctor, hospital for [employer]   Act: no regular exercise   Diet: good water, fruits/vegetables daily   Social Determinants of Corporate investment banker Strain: Not on file  Food Insecurity: Not on file  Transportation Needs: Not on file  Physical Activity: Not on file  Stress: Not on file  Social Connections: Not on file  Intimate Partner Violence: Not on file    Review of Systems: See HPI, otherwise negative ROS  Physical Exam: LMP 10/29/2010  General:   Alert,  pleasant and cooperative in NAD Head:  Normocephalic and atraumatic. Neck:  Supple; no masses or thyromegaly. Lungs:  Clear throughout to auscultation, normal respiratory effort.    Heart:  +S1, +S2, Regular  rate and rhythm, No edema. Abdomen:  Soft, nontender and nondistended. Normal bowel sounds, without guarding, and without rebound.   Neurologic:  Alert and  oriented x4;  grossly normal neurologically.  Impression/Plan: Kristen Soto is here for an colonoscopy to be performed for Screening colonoscopy average risk   Risks, benefits, limitations, and alternatives regarding  colonoscopy have been reviewed with the patient.  Questions have been answered.  All parties agreeable.   Wyline Mood, MD  01/18/2023, 10:03 AM

## 2023-01-18 NOTE — Transfer of Care (Signed)
Immediate Anesthesia Transfer of Care Note  Patient: Kristen Soto  Procedure(s) Performed: COLONOSCOPY WITH PROPOFOL  Patient Location: Endoscopy Unit  Anesthesia Type:General  Level of Consciousness: drowsy  Airway & Oxygen Therapy: Patient Spontanous Breathing  Post-op Assessment: Report given to RN and Post -op Vital signs reviewed and stable  Post vital signs: Reviewed and stable  Last Vitals:  Vitals Value Taken Time  BP 108/76 1110  Temp 35.8 1110  Pulse 74 1110  Resp 16 1110  SpO2 98 1110    Last Pain:  Vitals:   01/18/23 1025  TempSrc: Temporal  PainSc: 0-No pain         Complications: No notable events documented.

## 2023-01-18 NOTE — Op Note (Signed)
[facility] Patient Name: Kristen Soto Procedure Date: 01/18/2023 10:40 AM MRN: [id number] Account #: [id number] Date of Birth: [birth year] Admit Type: Outpatient Age: 67 Room: [facility] ENDO ROOM 2 Gender: Female Note Status: Finalized Instrument Name: Prentice Docker [id number] Procedure:             Colonoscopy Indications:           Screening for colorectal malignant neoplasm Providers:             Wyline Mood MD, MD Referring MD:          Eustaquio Boyden (Referring MD) Medicines:             Monitored Anesthesia Care Complications:         No immediate complications. Procedure:             Pre-Anesthesia Assessment:                        - Prior to the procedure, a History and Physical was                         performed, and patient medications, allergies and                         sensitivities were reviewed. The patient's tolerance                         of previous anesthesia was reviewed.                        - The risks and benefits of the procedure and the                         sedation options and risks were discussed with the                         patient. All questions were answered and informed                         consent was obtained.                        - ASA Grade Assessment: II - A patient with mild                         systemic disease.                        After obtaining informed consent, the colonoscope was                         passed under direct vision. Throughout the procedure,                         the patient's blood pressure, pulse, and oxygen                         saturations were monitored continuously. The                         Colonoscope was introduced through  the anus and                         advanced to the the cecum, identified by the                         appendiceal orifice. The colonoscopy was performed                         with ease. The patient tolerated the procedure well.                          The quality of the bowel preparation was excellent.                         The ileocecal valve, appendiceal orifice, and rectum                         were photographed. Findings:      The entire examined colon appeared normal on direct and retroflexion       views. Impression:            - The entire examined colon is normal on direct and                         retroflexion views.                        - No specimens collected. Recommendation:        - Discharge patient to home (with escort).                        - Resume previous diet.                        - Continue present medications.                        - Repeat colonoscopy is not recommended due to current                         age (10 years or older) for screening purposes. Procedure Code(s):     --- Professional ---                        [phone], Colonoscopy, flexible; diagnostic, including                         collection of specimen(s) by brushing or washing, when                         performed (separate procedure) Diagnosis Code(s):     --- Professional ---                        Z12.11, Encounter for screening for malignant neoplasm                         of colon CPT copyright 2022 American Medical Association. All rights reserved. The codes documented in this report are preliminary and upon coder review  may  be revised to meet current compliance requirements. Wyline Mood, MD Wyline Mood MD, MD 01/18/2023 11:09:15 AM This report has been signed electronically. Number of Addenda: 0 Note Initiated On: 01/18/2023 10:40 AM Scope Withdrawal Time: 0 hours 6 minutes 38 seconds  Total Procedure Duration: 0 hours 11 minutes 40 seconds  Estimated Blood Loss:  Estimated blood loss: none.      [facility]

## 2023-01-18 NOTE — Anesthesia Postprocedure Evaluation (Signed)
Anesthesia Post Note  Patient: Kristen Soto  Procedure(s) Performed: COLONOSCOPY WITH PROPOFOL  Patient location during evaluation: PACU Anesthesia Type: General Level of consciousness: awake and alert, oriented and patient cooperative Pain management: pain level controlled Vital Signs Assessment: post-procedure vital signs reviewed and stable Respiratory status: spontaneous breathing, nonlabored ventilation and respiratory function stable Cardiovascular status: blood pressure returned to baseline and stable Postop Assessment: adequate PO intake Anesthetic complications: no   No notable events documented.   Last Vitals:  Vitals:   01/18/23 1025 01/18/23 1111  BP: (!) 151/91 118/63  Pulse: 68   Resp: 18   Temp: (!) 35.8 C (!) 36.1 C  SpO2: 99%     Last Pain:  Vitals:   01/18/23 1131  TempSrc:   PainSc: 0-No pain                 Reed Breech

## 2023-01-18 NOTE — Anesthesia Preprocedure Evaluation (Addendum)
Anesthesia Evaluation  Patient identified by MRN, date of birth, ID band Patient awake    Reviewed: Allergy & Precautions, NPO status , Patient's Chart, lab work & pertinent test results  History of Anesthesia Complications Negative for: history of anesthetic complications  Airway Mallampati: III   Neck ROM: Full    Dental no notable dental hx.    Pulmonary former smoker (quit 22 years ago)   Pulmonary exam normal breath sounds clear to auscultation       Cardiovascular hypertension, Normal cardiovascular exam Rhythm:Regular Rate:Normal     Neuro/Psych negative neurological ROS     GI/Hepatic negative GI ROS,,,  Endo/Other  negative endocrine ROS    Renal/GU negative Renal ROS     Musculoskeletal   Abdominal   Peds  Hematology Breast CA   Anesthesia Other Findings   Reproductive/Obstetrics                             Anesthesia Physical Anesthesia Plan  ASA: 2  Anesthesia Plan: General   Post-op Pain Management:    Induction: Intravenous  PONV Risk Score and Plan: 3 and Propofol infusion, TIVA and Treatment may vary due to age or medical condition  Airway Management Planned: Natural Airway  Additional Equipment:   Intra-op Plan:   Post-operative Plan:   Informed Consent: I have reviewed the patients History and Physical, chart, labs and discussed the procedure including the risks, benefits and alternatives for the proposed anesthesia with the patient or authorized representative who has indicated his/her understanding and acceptance.       Plan Discussed with: CRNA  Anesthesia Plan Comments: (LMA/GETA backup discussed.  Patient consented for risks of anesthesia including but not limited to:  - adverse reactions to medications - damage to eyes, teeth, lips or other oral mucosa - nerve damage due to positioning  - sore throat or hoarseness - damage to heart, brain,  nerves, lungs, other parts of body or loss of life  Informed patient about role of CRNA in peri- and intra-operative care.  Patient voiced understanding.)       Anesthesia Quick Evaluation

## 2023-01-19 ENCOUNTER — Encounter: Payer: Self-pay | Admitting: Gastroenterology

## 2023-01-19 ENCOUNTER — Encounter: Payer: Self-pay | Admitting: Family Medicine

## 2023-01-19 NOTE — Telephone Encounter (Signed)
Patient called in to ask about getting set up to receive prolia injection.

## 2023-01-20 NOTE — Telephone Encounter (Signed)
Can we recontact  her to schedule prolia? see recent mychart. Thanks!

## 2023-01-20 NOTE — Telephone Encounter (Signed)
Forwarded previous phone note to Joellen to schedule.

## 2023-01-24 ENCOUNTER — Ambulatory Visit
Admission: RE | Admit: 2023-01-24 | Discharge: 2023-01-24 | Disposition: A | Discharge status: Home / Self Care | Payer: Medicare Other | Source: Ambulatory Visit | Attending: Family Medicine | Admitting: Family Medicine

## 2023-01-24 ENCOUNTER — Other Ambulatory Visit: Payer: Self-pay

## 2023-01-24 DIAGNOSIS — R109 Unspecified abdominal pain: Secondary | ICD-10-CM | POA: Diagnosis not present

## 2023-01-24 DIAGNOSIS — G8929 Other chronic pain: Secondary | ICD-10-CM | POA: Insufficient documentation

## 2023-01-24 DIAGNOSIS — M810 Age-related osteoporosis without current pathological fracture: Secondary | ICD-10-CM

## 2023-01-24 NOTE — Telephone Encounter (Signed)
Called patient reviewed all following information including appointment, Co pay due at time of visit and if pick up of injection is needed from outside pharmacy.     Out of pocket for patient: $225  Lab appointment : 01/25/23  Nurse visit:  02/01/23  Lab order placed: Yes  Prolia has been  [x]   Ordered  []   Script sent to local pharmacy for patient to bring   []   Script sent to Specialty pharmacy   []   Script sent to [facility] to deliver

## 2023-01-25 ENCOUNTER — Other Ambulatory Visit: Payer: Medicare Other

## 2023-01-25 NOTE — Telephone Encounter (Signed)
error 

## 2023-01-28 ENCOUNTER — Other Ambulatory Visit (INDEPENDENT_AMBULATORY_CARE_PROVIDER_SITE_OTHER): Payer: Medicare Other

## 2023-01-28 DIAGNOSIS — M810 Age-related osteoporosis without current pathological fracture: Secondary | ICD-10-CM

## 2023-01-31 NOTE — Telephone Encounter (Addendum)
Looks like pt has for [place] tomorrow at 11:30 for Prolia inj.

## 2023-02-01 ENCOUNTER — Ambulatory Visit (INDEPENDENT_AMBULATORY_CARE_PROVIDER_SITE_OTHER): Payer: Medicare Other

## 2023-02-01 DIAGNOSIS — M810 Age-related osteoporosis without current pathological fracture: Secondary | ICD-10-CM | POA: Diagnosis not present

## 2023-02-01 MED ORDER — DENOSUMAB 60 MG/ML ~~LOC~~ SOSY
60.0000 mg | PREFILLED_SYRINGE | Freq: Once | SUBCUTANEOUS | Status: AC
Start: 2023-02-01 — End: 2023-02-01
  Administered 2023-02-01: 60 mg via SUBCUTANEOUS

## 2023-02-01 NOTE — Progress Notes (Signed)
After obtaining consent, and per orders of Dr. Sharen Hones, injection of Prolia given SQ in left arm by Valentino Nose. Patient tolerated injection well.

## 2023-02-15 ENCOUNTER — Telehealth: Payer: Self-pay | Admitting: Family Medicine

## 2023-02-15 NOTE — Telephone Encounter (Signed)
Patient returned call from lisa, would like a call back when ever possible.

## 2023-02-15 NOTE — Telephone Encounter (Signed)
Spoke with pt (see Imaging, Mammogram- 12/01/22).

## 2023-03-07 NOTE — Telephone Encounter (Signed)
I still have not received diagnostic mammogram. Can we re request this?

## 2023-03-08 NOTE — Telephone Encounter (Signed)
Faxed request again to [facility] at [phone].

## 2023-03-28 NOTE — Telephone Encounter (Signed)
I never received records from [facility] imaging.  I did review results in Care Everywhere - benign cyst, rpt 1 yr, Birads2.

## 2023-05-06 ENCOUNTER — Other Ambulatory Visit: Payer: Self-pay | Admitting: Medical Genetics

## 2023-05-06 DIAGNOSIS — Z006 Encounter for examination for normal comparison and control in clinical research program: Secondary | ICD-10-CM

## 2023-05-18 ENCOUNTER — Other Ambulatory Visit
Admission: RE | Admit: 2023-05-18 | Discharge: 2023-05-18 | Disposition: A | Discharge status: Home / Self Care | Payer: Medicare Other | Source: Ambulatory Visit | Attending: Medical Genetics | Admitting: Medical Genetics

## 2023-05-18 DIAGNOSIS — Z006 Encounter for examination for normal comparison and control in clinical research program: Secondary | ICD-10-CM | POA: Insufficient documentation

## 2023-05-28 LAB — GENECONNECT MOLECULAR SCREEN

## 2023-05-28 LAB — HELIX MOLECULAR SCREEN: Genetic Analysis Overall Interpretation: NEGATIVE

## 2023-07-19 ENCOUNTER — Encounter: Payer: Self-pay | Admitting: Family Medicine

## 2023-07-19 ENCOUNTER — Ambulatory Visit: Payer: Medicare Other | Admitting: Family Medicine

## 2023-07-19 VITALS — BP 120/80 | HR 60 | Temp 97.5°F | Ht 60.75 in | Wt 143.0 lb

## 2023-07-19 DIAGNOSIS — M7061 Trochanteric bursitis, right hip: Secondary | ICD-10-CM | POA: Insufficient documentation

## 2023-07-19 MED ORDER — DICLOFENAC SODIUM 75 MG PO TBEC: 75.0000 mg | DELAYED_RELEASE_TABLET | Freq: Two times a day (BID) | ORAL | 0 refills | Status: DC

## 2023-07-19 NOTE — Progress Notes (Signed)
 Patient ID: Kristen Soto, female    DOB: 12/21/1955, 68 y.o.   MRN: [id number]  This visit was conducted in person.  BP 120/80 (BP Location: Left Arm, Patient Position: Sitting, Cuff Size: Normal)   Pulse 60   Temp (!) 97.5 F (36.4 C) (Temporal)   Ht 5' 0.75 (1.543 m)   Wt 143 lb (64.9 kg)   LMP 10/29/2010   SpO2 98%   BMI 27.24 kg/m    CC:  Chief Complaint  Patient presents with   Hip Pain    Right-Started on Saturday after bending a lot putting away Christmas decorations    Subjective:   HPI: Kristen Soto is a 68 y.o. female patient of Dr. Rilla with history of osteoporosis, thoracolumbar OA back pain, chronic right flank pain presenting on 07/19/2023 for Hip Pain (Right-Started on Saturday after bending a lot putting away Christmas decorations)  She presents with new onset right hip pain ongoing in the last 4 days. Prior to the hip pain she had been bending a lot putting away Christmas decorations.  Pain described as severe stabbing pain. Pain is right lateral primary. Some mild low back pain.  No new numbness, no new weakness.  No radiation of pain.  Pain with external rotation of hip.  Pain greatest bending down.  Has pain with pressure onright.   No falls, did do Yoga 1 day prior   Has been using ibuprofen 400 mg... helped significantly.      Relevant past medical, surgical, family and social history reviewed and updated as indicated. Interim medical history since our last visit reviewed. Allergies and medications reviewed and updated. Outpatient Medications Prior to Visit  Medication Sig Dispense Refill   amLODipine  (NORVASC ) 5 MG tablet Take 1 tablet (5 mg total) by mouth daily. 90 tablet 4   Calcium  Carb-Cholecalciferol (CALCIUM -VITAMIN D ) 600-400 MG-UNIT TABS Take 1 tablet by mouth daily. 60 tablet    Multiple Vitamin (MULTIVITAMIN) tablet Take 1 tablet by mouth daily.     Omega-3 Fatty Acids (FISH OIL PO) Take by mouth daily.     valsartan   (DIOVAN ) 160 MG tablet Take 1 tablet (160 mg total) by mouth daily. 90 tablet 4   zoledronic  acid (RECLAST ) 5 MG/100ML SOLN injection Inject 100 mLs (5 mg total) into the vein. Repeat yearly (Patient not taking: Reported on 11/24/2022)     No facility-administered medications prior to visit.     Per HPI unless specifically indicated in ROS section below Review of Systems  Constitutional:  Negative for fatigue and fever.  HENT:  Negative for congestion.   Eyes:  Negative for pain.  Respiratory:  Negative for cough and shortness of breath.   Cardiovascular:  Negative for chest pain, palpitations and leg swelling.  Gastrointestinal:  Negative for abdominal pain.  Genitourinary:  Negative for dysuria and vaginal bleeding.  Musculoskeletal:  Negative for back pain.  Neurological:  Negative for syncope, light-headedness and headaches.  Psychiatric/Behavioral:  Negative for dysphoric mood.    Objective:  BP 120/80 (BP Location: Left Arm, Patient Position: Sitting, Cuff Size: Normal)   Pulse 60   Temp (!) 97.5 F (36.4 C) (Temporal)   Ht 5' 0.75 (1.543 m)   Wt 143 lb (64.9 kg)   LMP 10/29/2010   SpO2 98%   BMI 27.24 kg/m   Wt Readings from Last 3 Encounters:  07/19/23 143 lb (64.9 kg)  01/18/23 138 lb 6.4 oz (62.8 kg)  12/07/22 139 lb (63 kg)  Physical Exam Constitutional:      General: She is not in acute distress.    Appearance: Normal appearance. She is well-developed. She is not ill-appearing or toxic-appearing.  HENT:     Head: Normocephalic.     Right Ear: Hearing, tympanic membrane, ear canal and external ear normal. Tympanic membrane is not erythematous, retracted or bulging.     Left Ear: Hearing, tympanic membrane, ear canal and external ear normal. Tympanic membrane is not erythematous, retracted or bulging.     Nose: No mucosal edema or rhinorrhea.     Right Sinus: No maxillary sinus tenderness or frontal sinus tenderness.     Left Sinus: No maxillary sinus  tenderness or frontal sinus tenderness.     Mouth/Throat:     Pharynx: Uvula midline.  Eyes:     General: Lids are normal. Lids are everted, no foreign bodies appreciated.     Conjunctiva/sclera: Conjunctivae normal.     Pupils: Pupils are equal, round, and reactive to light.  Neck:     Thyroid : No thyroid  mass or thyromegaly.     Vascular: No carotid bruit.     Trachea: Trachea normal.  Cardiovascular:     Rate and Rhythm: Normal rate and regular rhythm.     Pulses: Normal pulses.     Heart sounds: Normal heart sounds, S1 normal and S2 normal. No murmur heard.    No friction rub. No gallop.  Pulmonary:     Effort: Pulmonary effort is normal. No tachypnea or respiratory distress.     Breath sounds: Normal breath sounds. No decreased breath sounds, wheezing, rhonchi or rales.  Abdominal:     General: Bowel sounds are normal.     Palpations: Abdomen is soft.     Tenderness: There is no abdominal tenderness.  Musculoskeletal:     Cervical back: Normal range of motion and neck supple.     Thoracic back: Normal.     Lumbar back: Normal. No tenderness or bony tenderness. Normal range of motion. Negative right straight leg raise test and negative left straight leg raise test.     Right hip: Tenderness and bony tenderness present. No deformity. Decreased range of motion.     Comments:  Ttp over right trochanteric bursa  Skin:    General: Skin is warm and dry.     Findings: No rash.  Neurological:     Mental Status: She is alert.  Psychiatric:        Mood and Affect: Mood is not anxious or depressed.        Speech: Speech normal.        Behavior: Behavior normal. Behavior is cooperative.        Thought Content: Thought content normal.        Judgment: Judgment normal.       Results for orders placed or performed during the hospital encounter of 05/18/23  [facility] Molecular Screen- Blood ([facility])   Collection Time: 05/18/23 10:39 AM  Result Value Ref Range    Genetic Analysis Overall Interpretation Negative    Genetic Disease Assessed      [facility] Tier One Population Screen is a screening test that analyzes 11 genes related to hereditary breast and ovarian cancer (HBOC) syndrome, Lynch syndrome, and familial hypercholesterolemia. This test only reports clinically significant pathogenic and  likely pathogenic variants but does not report variants of uncertain significance (VUS). In addition, analysis of the PMS2 gene excludes exons 11-15, which overlap with a known pseudogene (  PMS2CL).    Genetic Analysis Report      No pathogenic or likely pathogenic variants were detected in the genes analyzed by this test.Genetic test results should be interpreted in the context of an individual's personal medical and family history. Alteration to medical management is NOT  recommended based solely on this result. Clinical correlation is advised.Additional Considerations- This is a screening test; individuals may still carry pathogenic or likely pathogenic variant(s) in the tested genes that are not detected by this test.-  For individuals at risk for these or other related conditions based on factors including personal or family history, diagnostic testing is recommended.- The absence of pathogenic or likely pathogenic variant(s) in the analyzed genes, while reassuring,  does not eliminate the possibility of a hereditary condition; there are other variants and genes associated with heart disease and hereditary cancer that are not included in this test.    Genes Tested See Notes    Disclaimer See Notes    Sequencing Location See Notes    Interpretation Methods and Limitations See Notes     Assessment and Plan  Trochanteric bursitis of right hip Assessment & Plan:  Acute, no red flags for fracture.  Symptoms most consistent with hi[p bursitits.  Treat with NSAIDs, ICe and home PT.. info given.  Diclofenac  75 mg BID  Follow up if not improving as  expected.   Other orders -     Diclofenac  Sodium; Take 1 tablet (75 mg total) by mouth 2 (two) times daily.  Dispense: 30 tablet; Refill: 0    No follow-ups on file.   Greig Ring, MD

## 2023-07-19 NOTE — Assessment & Plan Note (Signed)
 Acute, no red flags for fracture.  Symptoms most consistent with hi[p bursitits.  Treat with NSAIDs, ICe and home PT.. info given.  Diclofenac 75 mg BID  Follow up if not improving as expected.

## 2023-07-19 NOTE — Patient Instructions (Signed)
 Start diclofenac twice daily  for pain and inflammation. Start home exercises.  Follow up with PCP if not improving as expected in 2-4 weeks. Call sooner if pain worsening.

## 2023-07-25 ENCOUNTER — Encounter: Payer: Self-pay | Admitting: Family Medicine

## 2023-07-25 DIAGNOSIS — M810 Age-related osteoporosis without current pathological fracture: Secondary | ICD-10-CM

## 2023-07-25 MED ORDER — DENOSUMAB 60 MG/ML ~~LOC~~ SOSY
60.0000 mg | PREFILLED_SYRINGE | Freq: Once | SUBCUTANEOUS | Status: AC
Start: 2023-08-08 — End: ?

## 2023-07-26 ENCOUNTER — Telehealth: Payer: Self-pay

## 2023-07-26 NOTE — Telephone Encounter (Signed)
 Prolia VOB initiated via [email or web address]  Next Prolia inj DUE: 08/01/23

## 2023-07-27 ENCOUNTER — Other Ambulatory Visit (HOSPITAL_COMMUNITY): Payer: Self-pay

## 2023-08-02 ENCOUNTER — Other Ambulatory Visit (HOSPITAL_COMMUNITY): Payer: Self-pay

## 2023-08-05 ENCOUNTER — Other Ambulatory Visit (HOSPITAL_COMMUNITY): Payer: Self-pay

## 2023-08-18 ENCOUNTER — Other Ambulatory Visit (HOSPITAL_COMMUNITY): Payer: Self-pay

## 2023-08-25 NOTE — Telephone Encounter (Signed)
Pharmacy Patient Advocate Encounter  Received notification from  [facility]  that Prior Authorization for Prolia has been APPROVED from 08/25/23 to 08/24/24   PA #/Case ID/Reference #: [id number]

## 2023-08-25 NOTE — Telephone Encounter (Signed)
Pharmacy Patient Advocate Encounter   Received notification from  [employer]  that prior authorization for Prolia is required/requested.   Insurance verification completed.   The patient is insured through  [employer]  .   Per test claim: PA required; PA submitted to above mentioned insurance via [facility] [facility] portal Key/confirmation #/EOC [id number] Status is pending

## 2023-08-26 NOTE — Telephone Encounter (Signed)
Message sent back for clarification. See referral for documentation.

## 2023-08-29 ENCOUNTER — Other Ambulatory Visit (HOSPITAL_COMMUNITY): Payer: Self-pay

## 2023-08-29 NOTE — Telephone Encounter (Signed)
 Pt ready for scheduling for Prolia on or after : 08/29/23  Out-of-pocket cost due at time of visit: $410.16  Number of injection/visits approved: 2  Primary: [employer] Prolia co-insurance: 15% Admin fee co-insurance: $20  Secondary: N/A Prolia co-insurance:  Admin fee co-insurance:   Medical Benefit Details: Date Benefits were checked: 08/29/23 Deductible: $125 ($90.80 met)/ Coinsurance: 15%/ Admin Fee: $20  Prior Auth: Approved PA# [id number] Expiration Date: 08/25/23 to 08/24/24  # of doses approved: 2  Pharmacy benefit: Copay $956.38 If patient wants fill through the pharmacy benefit please send prescription to:  [facility] , and include estimated need by date in rx notes. Pharmacy will ship medication directly to the office.  Patient may be eligible for Prolia Copay Card. Copay Card can make patient's cost as little as $25. Link to apply: [email or web address]  ** This summary of benefits is an estimation of the patient's out-of-pocket cost. Exact cost may very based on individual plan coverage.    Call reference: [phone]

## 2023-09-02 NOTE — Telephone Encounter (Signed)
 Patient called in to follow up on Prolia injection and labs.

## 2023-09-05 ENCOUNTER — Other Ambulatory Visit: Payer: Self-pay

## 2023-09-05 DIAGNOSIS — M810 Age-related osteoporosis without current pathological fracture: Secondary | ICD-10-CM

## 2023-09-06 ENCOUNTER — Telehealth: Payer: Self-pay | Admitting: Family Medicine

## 2023-09-06 ENCOUNTER — Other Ambulatory Visit (INDEPENDENT_AMBULATORY_CARE_PROVIDER_SITE_OTHER)

## 2023-09-06 DIAGNOSIS — M810 Age-related osteoporosis without current pathological fracture: Secondary | ICD-10-CM | POA: Diagnosis not present

## 2023-09-06 LAB — BASIC METABOLIC PANEL
BUN: 14 mg/dL (ref 6–23)
CO2: 30 meq/L (ref 19–32)
Calcium: 9.5 mg/dL (ref 8.4–10.5)
Chloride: 103 meq/L (ref 96–112)
Creatinine, Ser: 0.72 mg/dL (ref 0.40–1.20)
GFR: 86.85 mL/min (ref >60.00)
Glucose, Bld: 88 mg/dL (ref 70–99)
Potassium: 3.5 meq/L (ref 3.5–5.1)
Sodium: 139 meq/L (ref 135–145)

## 2023-09-06 NOTE — Telephone Encounter (Signed)
 Placed form in Dr. Timoteo Expose box.

## 2023-09-06 NOTE — Telephone Encounter (Signed)
 Patient dropped off form to be sighed by provider put in providers box at front desk

## 2023-09-07 NOTE — Telephone Encounter (Signed)
 Spoke with pt notifying her the form is ready to pick up. States she will pick up at 09/13/23 [place].   [Placed form at front office. Made copy to scan.]

## 2023-09-07 NOTE — Telephone Encounter (Addendum)
 Signed diver form and in Lisa's box.  BP Readings from Last 3 Encounters:  07/19/23 120/80  01/18/23 118/63  12/07/22 134/82

## 2023-09-13 ENCOUNTER — Ambulatory Visit

## 2023-09-13 DIAGNOSIS — M810 Age-related osteoporosis without current pathological fracture: Secondary | ICD-10-CM

## 2023-09-13 MED ORDER — DENOSUMAB 60 MG/ML ~~LOC~~ SOSY
60.0000 mg | PREFILLED_SYRINGE | Freq: Once | SUBCUTANEOUS | Status: AC
Start: 2023-09-13 — End: 2023-09-13
  Administered 2023-09-13: 60 mg via SUBCUTANEOUS

## 2023-09-13 MED ORDER — DENOSUMAB 60 MG/ML ~~LOC~~ SOSY
60.0000 mg | PREFILLED_SYRINGE | Freq: Once | SUBCUTANEOUS | Status: DC
Start: 2023-09-27 — End: 2023-09-13

## 2023-09-13 MED ORDER — DENOSUMAB 60 MG/ML ~~LOC~~ SOSY
60.0000 mg | PREFILLED_SYRINGE | Freq: Once | SUBCUTANEOUS | Status: AC
Start: 2024-03-15 — End: ?

## 2023-09-13 NOTE — Progress Notes (Signed)
 Per orders of Dr. Eustaquio Boyden, injection of Prolia  given by Eual Fines in Right Arm. Patient tolerated injection well. Patient aware next injection will be in 6 months.

## 2023-11-08 ENCOUNTER — Ambulatory Visit: Admitting: Obstetrics & Gynecology

## 2023-11-08 VITALS — BP 144/79 | HR 69 | Wt 139.0 lb

## 2023-11-08 DIAGNOSIS — N812 Incomplete uterovaginal prolapse: Secondary | ICD-10-CM

## 2023-11-08 NOTE — Progress Notes (Signed)
   GYNECOLOGY OFFICE VISIT NOTE  History:   Kristen Soto is a 68 y.o. [phone] here today for evaluation of vaginal bulge over there last few weeks. Feels hard, notices more when standing.  No urinary incontinence reported.  No pain. She denies any abnormal vaginal discharge, bleeding, pelvic pain or other concerns.    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    Hypertension    Osteoporosis     Past Surgical History:  Procedure Laterality Date   BASAL CELL CARCINOMA EXCISION  05/2008   removal, left eyelid   BREAST LUMPECTOMY     COLONOSCOPY WITH PROPOFOL  N/A 01/18/2023   Procedure: COLONOSCOPY WITH PROPOFOL ;  Surgeon: Luke Salaam, MD;  Location: [facility] ENDOSCOPY;  Service: Gastroenterology;  Laterality: N/A;   CYSTOSCOPY  02/02/2000   mild urethral trigonitis   ELBOW SURGERY Right 2019    The following portions of the patient's history were reviewed and updated as appropriate: allergies, current medications, past family history, past medical history, past social history, past surgical history and problem list.   Health Maintenance:  Normal pap and negative HRHPV on 11/27/2020.  Normal mammogram on 12/01/2022.   Review of Systems:  Pertinent items noted in HPI and remainder of comprehensive ROS otherwise negative.  Physical Exam:  BP (!) 144/79   Pulse 69   Wt 139 lb (63 kg)   LMP 10/29/2010   BMI 26.48 kg/m  CONSTITUTIONAL: Well-developed, well-nourished female in no acute distress.  MUSCULOSKELETAL: Normal range of motion. No edema noted. NEUROLOGIC: Alert and oriented to person, place, and time. Normal muscle tone coordination. No cranial nerve  deficit noted on observation. PSYCHIATRIC: Normal mood and affect. Normal behavior. Normal judgment and thought content. CARDIOVASCULAR: Normal heart rate noted RESPIRATORY: Effort and breath sounds normal, no problems with respiration noted ABDOMEN: No masses or other overt distention noted on observation. No tenderness.   PELVIC: Normal appearing external genitalia and normal urethral meatus with mild-moderate atrophy; atrophic appearing vaginal mucosa and cervix.  Grade 1 uterovaginal prolapse noted, becomes Grade 2 when standing. No abnormal discharge noted. Performed in the presence of a chaperone Earnestine Goes, RN)    Assessment and Plan:    1. Uterovaginal prolapse (Primary) Patient has pelvic organ prolapse.   Referred to Urogynecology for further evaluation. - Ambulatory referral to Urogynecology  Routine preventative health maintenance measures emphasized. Please refer to After Visit Summary for other counseling recommendations.   Return for any gynecologic concerns.    I spent 30 minutes dedicated to the care of this patient including pre-visit review of records, face to face time with the patient discussing her conditions and treatments, post visit ordering of medications and appropriate tests or procedures, coordinating care and documenting this visit encounter.    Lenoard Rad, MD, FACOG Obstetrician & Gynecologist, [facility] [employer], [facility]

## 2023-11-11 ENCOUNTER — Other Ambulatory Visit: Payer: Medicare Other

## 2023-11-18 ENCOUNTER — Encounter: Payer: Medicare Other | Admitting: Family Medicine

## 2023-11-21 ENCOUNTER — Other Ambulatory Visit: Payer: Medicare Other

## 2023-11-22 ENCOUNTER — Other Ambulatory Visit: Payer: Medicare Other

## 2023-11-25 ENCOUNTER — Encounter: Payer: Medicare Other | Admitting: Family Medicine

## 2023-11-28 ENCOUNTER — Other Ambulatory Visit: Payer: Self-pay | Admitting: Family Medicine

## 2023-11-28 DIAGNOSIS — Z9189 Other specified personal risk factors, not elsewhere classified: Secondary | ICD-10-CM

## 2023-11-28 DIAGNOSIS — E785 Hyperlipidemia, unspecified: Secondary | ICD-10-CM

## 2023-11-28 DIAGNOSIS — E042 Nontoxic multinodular goiter: Secondary | ICD-10-CM

## 2023-11-28 DIAGNOSIS — E559 Vitamin D deficiency, unspecified: Secondary | ICD-10-CM

## 2023-12-01 ENCOUNTER — Other Ambulatory Visit (INDEPENDENT_AMBULATORY_CARE_PROVIDER_SITE_OTHER)

## 2023-12-01 DIAGNOSIS — E559 Vitamin D deficiency, unspecified: Secondary | ICD-10-CM

## 2023-12-01 DIAGNOSIS — E042 Nontoxic multinodular goiter: Secondary | ICD-10-CM

## 2023-12-01 DIAGNOSIS — E785 Hyperlipidemia, unspecified: Secondary | ICD-10-CM | POA: Diagnosis not present

## 2023-12-01 DIAGNOSIS — Z9189 Other specified personal risk factors, not elsewhere classified: Secondary | ICD-10-CM

## 2023-12-01 LAB — CBC WITH DIFFERENTIAL/PLATELET
Basophils Absolute: 0 10*3/uL (ref 0.0–0.1)
Basophils Relative: 0.7 % (ref 0.0–3.0)
Eosinophils Absolute: 0.1 10*3/uL (ref 0.0–0.7)
Eosinophils Relative: 1.3 % (ref 0.0–5.0)
HCT: 42.6 % (ref 36.0–46.0)
Hemoglobin: 14.6 g/dL (ref 12.0–15.0)
Lymphocytes Relative: 29 % (ref 12.0–46.0)
Lymphs Abs: 1.7 10*3/uL (ref 0.7–4.0)
MCHC: 34.3 g/dL (ref 30.0–36.0)
MCV: 91 fl (ref 78.0–100.0)
Monocytes Absolute: 0.4 10*3/uL (ref 0.1–1.0)
Monocytes Relative: 6.7 % (ref 3.0–12.0)
Neutro Abs: 3.6 10*3/uL (ref 1.4–7.7)
Neutrophils Relative %: 62.3 % (ref 43.0–77.0)
Platelets: 257 10*3/uL (ref 150.0–400.0)
RBC: 4.68 Mil/uL (ref 3.87–5.11)
RDW: 12.9 % (ref 11.5–15.5)
WBC: 5.7 10*3/uL (ref 4.0–10.5)

## 2023-12-01 LAB — LIPID PANEL
Cholesterol: 257 mg/dL — ABNORMAL HIGH (ref 0–200)
HDL: 66.9 mg/dL (ref >39.00)
LDL Cholesterol: 148 mg/dL — ABNORMAL HIGH (ref 0–99)
NonHDL: 189.84
Total CHOL/HDL Ratio: 4
Triglycerides: 208 mg/dL — ABNORMAL HIGH (ref 0.0–149.0)
VLDL: 41.6 mg/dL — ABNORMAL HIGH (ref 0.0–40.0)

## 2023-12-01 LAB — COMPREHENSIVE METABOLIC PANEL WITH GFR
ALT: 30 U/L (ref 0–35)
AST: 25 U/L (ref 0–37)
Albumin: 4.8 g/dL (ref 3.5–5.2)
Alkaline Phosphatase: 58 U/L (ref 39–117)
BUN: 14 mg/dL (ref 6–23)
CO2: 29 meq/L (ref 19–32)
Calcium: 9.6 mg/dL (ref 8.4–10.5)
Chloride: 101 meq/L (ref 96–112)
Creatinine, Ser: 0.88 mg/dL (ref 0.40–1.20)
GFR: 68.15 mL/min (ref >60.00)
Glucose, Bld: 92 mg/dL (ref 70–99)
Potassium: 3.5 meq/L (ref 3.5–5.1)
Sodium: 139 meq/L (ref 135–145)
Total Bilirubin: 0.9 mg/dL (ref 0.2–1.2)
Total Protein: 6.9 g/dL (ref 6.0–8.3)

## 2023-12-01 LAB — TSH: TSH: 1.55 u[IU]/mL (ref 0.35–5.50)

## 2023-12-01 LAB — VITAMIN D 25 HYDROXY (VIT D DEFICIENCY, FRACTURES): VITD: 35.33 ng/mL (ref 30.00–100.00)

## 2023-12-02 ENCOUNTER — Encounter: Payer: Self-pay | Admitting: Family Medicine

## 2023-12-02 ENCOUNTER — Other Ambulatory Visit

## 2023-12-02 ENCOUNTER — Ambulatory Visit: Payer: Self-pay | Admitting: Family Medicine

## 2023-12-02 DIAGNOSIS — E785 Hyperlipidemia, unspecified: Secondary | ICD-10-CM

## 2023-12-07 ENCOUNTER — Other Ambulatory Visit (INDEPENDENT_AMBULATORY_CARE_PROVIDER_SITE_OTHER)

## 2023-12-07 ENCOUNTER — Ambulatory Visit: Payer: Self-pay | Admitting: Family Medicine

## 2023-12-07 DIAGNOSIS — E785 Hyperlipidemia, unspecified: Secondary | ICD-10-CM | POA: Diagnosis not present

## 2023-12-07 LAB — LIPID PANEL
Cholesterol: 238 mg/dL — ABNORMAL HIGH (ref 0–200)
HDL: 65.7 mg/dL (ref >39.00)
LDL Cholesterol: 137 mg/dL — ABNORMAL HIGH (ref 0–99)
NonHDL: 171.91
Total CHOL/HDL Ratio: 4
Triglycerides: 176 mg/dL — ABNORMAL HIGH (ref 0.0–149.0)
VLDL: 35.2 mg/dL (ref 0.0–40.0)

## 2023-12-08 LAB — HM MAMMOGRAPHY

## 2023-12-09 ENCOUNTER — Encounter: Payer: Self-pay | Admitting: Family Medicine

## 2023-12-09 ENCOUNTER — Ambulatory Visit: Admitting: Family Medicine

## 2023-12-09 ENCOUNTER — Ambulatory Visit: Payer: Self-pay | Admitting: Family Medicine

## 2023-12-09 VITALS — BP 138/80 | HR 62 | Temp 97.7°F | Ht 60.5 in | Wt 141.2 lb

## 2023-12-09 DIAGNOSIS — Z7189 Other specified counseling: Secondary | ICD-10-CM | POA: Diagnosis not present

## 2023-12-09 DIAGNOSIS — I10 Essential (primary) hypertension: Secondary | ICD-10-CM

## 2023-12-09 DIAGNOSIS — Z0000 Encounter for general adult medical examination without abnormal findings: Secondary | ICD-10-CM | POA: Diagnosis not present

## 2023-12-09 DIAGNOSIS — E785 Hyperlipidemia, unspecified: Secondary | ICD-10-CM

## 2023-12-09 DIAGNOSIS — M810 Age-related osteoporosis without current pathological fracture: Secondary | ICD-10-CM

## 2023-12-09 DIAGNOSIS — E559 Vitamin D deficiency, unspecified: Secondary | ICD-10-CM

## 2023-12-09 DIAGNOSIS — Z853 Personal history of malignant neoplasm of breast: Secondary | ICD-10-CM

## 2023-12-09 MED ORDER — AMLODIPINE BESYLATE 5 MG PO TABS: 5.0000 mg | ORAL_TABLET | Freq: Every day | ORAL | 4 refills | Status: AC

## 2023-12-09 MED ORDER — VALSARTAN 160 MG PO TABS: 160.0000 mg | ORAL_TABLET | Freq: Every day | ORAL | 4 refills | Status: AC

## 2023-12-09 NOTE — Assessment & Plan Note (Addendum)
 Continue cal/vit D daily supplementation.

## 2023-12-09 NOTE — Assessment & Plan Note (Signed)

## 2023-12-09 NOTE — Assessment & Plan Note (Signed)
 Preventative protocols reviewed and updated unless pt declined. Discussed healthy diet and lifestyle.

## 2023-12-09 NOTE — Assessment & Plan Note (Addendum)
 Stable period on Prolia  for the past year - has received 2 shots.  Will continue this along with calcium , vit D and regular weight bearing exercise.  Consider updated DEXA 2026.

## 2023-12-09 NOTE — Assessment & Plan Note (Signed)
 Continue yearly mammograms through [facility].

## 2023-12-09 NOTE — Assessment & Plan Note (Addendum)
 Reviewed recent chol levels - anticipate recent vacation to [place] contributed to elevated triglycerides. Reviewed ASCVD risk and calcium  score of zero from last year - reasonable to continue healthy diet and lifestyle choices to control cholesterol, and remain off statin medication at this time.  The 10-year ASCVD risk score (Arnett DK, et al., 2019) is: 10.8%   Values used to calculate the score:     Age: 68 years     Sex: Female     Is Non-Hispanic African American: No     Diabetic: No     Tobacco smoker: No     Systolic Blood Pressure: 138 mmHg     Is BP treated: Yes     HDL Cholesterol: 65.7 mg/dL     Total Cholesterol: 238 mg/dL d

## 2023-12-09 NOTE — Assessment & Plan Note (Signed)
 Chronic, stable on current regimen - continue.

## 2023-12-09 NOTE — Progress Notes (Signed)
 Ph: [phone] Fax: [phone]   Patient ID: Kristen Soto, female    DOB: [birth year], 68 y.o.   MRN: [id number]  This visit was conducted in person.  BP 138/80   Pulse 62   Temp 97.7 F (36.5 C) (Oral)   Ht 5' 0.5" (1.537 m)   Wt 141 lb 4 oz (64.1 kg)   LMP 10/29/2010   SpO2 97%   BMI 27.13 kg/m   BP Readings from Last 3 Encounters:  12/09/23 138/80  11/08/23 (!) 144/79  07/19/23 120/80   Orthostatic VS for the past 72 hrs (Last 3 readings):  Orthostatic BP Patient Position  12/09/23 1255 144/84 Standing  12/09/23 1252 138/82 Supine   CC: AMW/CPE Subjective:   HPI: Kristen Soto is a 68 y.o. female presenting on 12/09/2023 for Medicare Wellness   Did not see health advisor this year.   Hearing Screening   500Hz  1000Hz  2000Hz  4000Hz   Right ear 20 25 20  40  Left ear 25 25 20  40  Vision Screening - Comments:: Last eye exam, 09/2023.  Flowsheet Row Office Visit from 12/09/2023 in [facility] [place]  PHQ-2 Total Score 0          12/09/2023   12:59 PM 12/07/2022   12:40 PM 11/24/2022   12:21 PM 11/13/2021    9:49 AM 03/16/2017   10:45 AM  Fall Risk   Falls in the past year? 0 0 0  No  Number falls in past yr:  0  0   Injury with Fall?  0  0   Risk for fall due to :  No Fall Risks     Follow up  Falls evaluation completed     She prefers not to shake hands.   Preventative: Colonoscopy 01/2023 - WNL Antony Baumgartner)  H/o breast cancer ~2006 s/p remote chemotherapy as well as tamoxifen then Evista  x5 yrs - yearly mammograms at [facility]), latest mammo 10/2023 Birads1.  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 Guidelines recommend yearly pelvic exam/pap smear of cervix and vagina and visualization of entire vagina due to increased risk of vaginal or cervical clear cell adenocarcinoma. Established with GYN Dr Barrett Lick s/p normal pap 11/2020 - stop cervical cancer screening. Prolapsed pelvic organs pending urogyn evaluation.   DEXA 10/2020 - T -2.7 spine, -1.2 L femur at [facility] DEXA 12/01/2022: T -2.5 lumbar spine, -2.3 L femoral neck  h/o osteoporosis - previously on weekly fosamax then monthly boniva  and regular calcium , vit D, weight bearing exercise. Previous prolia  became too expensive (took for about 1 year). Reclast  12/30/2021. Prolai restarted 01/2023 - last dose 09/13/2023.  Lung cancer screening - not eligible  Flu shot - declines  COVID vaccine - discussed, declines  Prevnar-20 11/2021 Td 2003, Tdap 2014  Shingrix - 2018, 2019 Advanced directive discussion - scanned 09/2020. Husband Kristen Soto and son Kristen Soto are HCPOA. Does not want prolonged life support if terminal condition. Does want artificial hydration/nutrition continued. HCPOA may override advanced directive.  Seat belt use discussed Sunscreen use discussed. No changing moles on skin. Sees derm regularly.  Ex smoker, quit 30 yrs ago.  Alcohol - a few glasses wine per week  Dentist - q6 mo Eye exam - yearly  Bowel - occ constipation managed effectively with senna tea  Bladder - no incontinence   Lives with husband Kristen Soto: retired 2015, was Doctor, hospital for [employer] Act: no regular exercise Diet: good water, fruits/vegetables  daily     Relevant past medical, surgical, family and social history reviewed and updated as indicated. Interim medical history since our last visit reviewed. Allergies and medications reviewed and updated. Outpatient Medications Prior to Visit  Medication Sig Dispense Refill   Calcium  Carb-Cholecalciferol (CALCIUM -VITAMIN D ) 600-400 MG-UNIT TABS Take 1 tablet by mouth daily. 60 tablet    Multiple Vitamin (MULTIVITAMIN) tablet Take 1 tablet by mouth daily.     amLODipine  (NORVASC ) 5 MG tablet Take 1 tablet (5 mg total) by mouth daily. 90 tablet 4   Omega-3 Fatty Acids (FISH OIL PO) Take by mouth daily.     valsartan  (DIOVAN ) 160 MG tablet Take 1 tablet (160 mg total) by mouth daily. 90 tablet 4   diclofenac   (VOLTAREN ) 75 MG EC tablet Take 1 tablet (75 mg total) by mouth 2 (two) times daily. 30 tablet 0   zoledronic  acid (RECLAST ) 5 MG/100ML SOLN injection Inject 5 mg into the vein. Repeat yearly     Facility-Administered Medications Prior to Visit  Medication Dose Route Frequency Provider Last Rate Last Admin   denosumab  (PROLIA ) injection 60 mg  60 mg Subcutaneous Once Caila Cirelli, MD       [START ON 03/15/2024] denosumab  (PROLIA ) injection 60 mg  60 mg Subcutaneous Once Claire Crick, MD         Per HPI unless specifically indicated in ROS section below Review of Systems  Constitutional:  Negative for activity change, appetite change, chills, fatigue, fever and unexpected weight change.  HENT:  Negative for hearing loss.   Eyes:  Negative for visual disturbance.  Respiratory:  Negative for cough, chest tightness, shortness of breath and wheezing.   Cardiovascular:  Negative for chest pain, palpitations and leg swelling.  Gastrointestinal:  Negative for abdominal distention, abdominal pain, blood in stool, constipation, diarrhea, nausea and vomiting.  Genitourinary:  Negative for difficulty urinating and hematuria.  Musculoskeletal:  Negative for arthralgias, myalgias and neck pain.  Skin:  Negative for rash.  Neurological:  Positive for dizziness (occ lightheadedness). Negative for seizures, syncope and headaches.  Hematological:  Negative for adenopathy. Does not bruise/bleed easily.  Psychiatric/Behavioral:  Negative for dysphoric mood. The patient is not nervous/anxious.     Objective:  BP 138/80   Pulse 62   Temp 97.7 F (36.5 C) (Oral)   Ht 5' 0.5" (1.537 m)   Wt 141 lb 4 oz (64.1 kg)   LMP 10/29/2010   SpO2 97%   BMI 27.13 kg/m   Wt Readings from Last 3 Encounters:  12/09/23 141 lb 4 oz (64.1 kg)  11/08/23 139 lb (63 kg)  07/19/23 143 lb (64.9 kg)      Physical Exam Vitals and nursing note reviewed.  Constitutional:      Appearance: Normal appearance. She is  not ill-appearing.  HENT:     Head: Normocephalic and atraumatic.     Right Ear: Tympanic membrane, ear canal and external ear normal. There is no impacted cerumen.     Left Ear: Tympanic membrane, ear canal and external ear normal. There is no impacted cerumen.     Mouth/Throat:     Mouth: Mucous membranes are moist.     Pharynx: Oropharynx is clear. No oropharyngeal exudate or posterior oropharyngeal erythema.  Eyes:     General:        Right eye: No discharge.        Left eye: No discharge.     Extraocular Movements: Extraocular movements intact.  Conjunctiva/sclera: Conjunctivae normal.     Pupils: Pupils are equal, round, and reactive to light.  Neck:     Thyroid : No thyroid  mass or thyromegaly.     Vascular: No carotid bruit.  Cardiovascular:     Rate and Rhythm: Normal rate and regular rhythm.     Pulses: Normal pulses.     Heart sounds: Normal heart sounds. No murmur heard. Pulmonary:     Effort: Pulmonary effort is normal. No respiratory distress.     Breath sounds: Normal breath sounds. No wheezing, rhonchi or rales.  Abdominal:     General: Bowel sounds are normal. There is no distension.     Palpations: Abdomen is soft. There is no mass.     Tenderness: There is no abdominal tenderness. There is no guarding or rebound.     Hernia: No hernia is present.  Musculoskeletal:     Cervical back: Normal range of motion and neck supple. No rigidity.     Right lower leg: No edema.     Left lower leg: No edema.  Lymphadenopathy:     Cervical: No cervical adenopathy.  Skin:    General: Skin is warm and dry.     Findings: No rash.  Neurological:     General: No focal deficit present.     Mental Status: She is alert. Mental status is at baseline.     Comments:  Recall 3/3 Calculation 5/5 DLROW  Psychiatric:        Mood and Affect: Mood normal.        Behavior: Behavior normal.       Results for orders placed or performed in visit on 12/08/23  HM MAMMOGRAPHY    Collection Time: 12/08/23 12:11 PM  Result Value Ref Range   HM Mammogram 0-4 Bi-Rad 0-4 Bi-Rad, Self Reported Normal    Assessment & Plan:   Problem List Items Addressed This Visit     Health maintenance examination (Chronic)   Preventative protocols reviewed and updated unless pt declined. Discussed healthy diet and lifestyle.       Advanced directives, counseling/discussion (Chronic)   Previously discussed      Medicare annual wellness visit, subsequent - Primary (Chronic)   I have personally reviewed the Medicare Annual Wellness questionnaire and have noted 1. The patient's medical and social history 2. Their use of alcohol, tobacco or illicit drugs 3. Their current medications and supplements 4. The patient's functional ability including ADL's, fall risks, home safety risks and hearing or visual impairment. Cognitive function has been assessed and addressed as indicated.  5. Diet and physical activity 6. Evidence for depression or mood disorders The patients weight, height, BMI have been recorded in the chart. I have made referrals, counseling and provided education to the patient based on review of the above and I have provided the pt with a written personalized care plan for preventive services. Provider list updated.. See scanned questionairre as needed for further documentation. Reviewed preventative protocols and updated unless pt declined.       Vitamin D  deficiency   Continue cal/vit D daily supplementation.       Essential hypertension   Chronic, stable on current regimen - continue.       Relevant Medications   amLODipine  (NORVASC ) 5 MG tablet   valsartan  (DIOVAN ) 160 MG tablet   BREAST CANCER, HX OF   Continue yearly mammograms through [facility].      Osteoporosis   Stable period on Prolia  for the past year -  has received 2 shots.  Will continue this along with calcium , vit D and regular weight bearing exercise.  Consider updated DEXA 2026.        Hyperlipidemia   Reviewed recent chol levels - anticipate recent vacation to [place] contributed to elevated triglycerides. Reviewed ASCVD risk and calcium  score of zero from last year - reasonable to continue healthy diet and lifestyle choices to control cholesterol, and remain off statin medication at this time.  The 10-year ASCVD risk score (Arnett DK, et al., 2019) is: 10.8%   Values used to calculate the score:     Age: 18 years     Sex: Female     Is Non-Hispanic African American: No     Diabetic: No     Tobacco smoker: No     Systolic Blood Pressure: 138 mmHg     Is BP treated: Yes     HDL Cholesterol: 65.7 mg/dL     Total Cholesterol: 238 mg/dL d      Relevant Medications   amLODipine  (NORVASC ) 5 MG tablet   valsartan  (DIOVAN ) 160 MG tablet     Meds ordered this encounter  Medications   amLODipine  (NORVASC ) 5 MG tablet    Sig: Take 1 tablet (5 mg total) by mouth daily.    Dispense:  90 tablet    Refill:  4   valsartan  (DIOVAN ) 160 MG tablet    Sig: Take 1 tablet (160 mg total) by mouth daily.    Dispense:  90 tablet    Refill:  4    No orders of the defined types were placed in this encounter.   Patient Instructions  Orthostatic blood pressures today You are doing well today - continue current medicines Good to see you today  Return as needed or in 1 year for next physical /wellness visit  Follow up plan: Return in about 1 year (around 12/08/2024) for annual exam, prior fasting for blood work, medicare wellness visit.  Claire Crick, MD

## 2023-12-09 NOTE — Patient Instructions (Addendum)
 Orthostatic blood pressures today You are doing well today - continue current medicines Good to see you today  Return as needed or in 1 year for next physical /wellness visit

## 2023-12-09 NOTE — Assessment & Plan Note (Signed)
 Previously discussed.

## 2024-02-13 ENCOUNTER — Telehealth: Payer: Self-pay

## 2024-02-13 ENCOUNTER — Other Ambulatory Visit (HOSPITAL_COMMUNITY): Payer: Self-pay

## 2024-02-13 NOTE — Telephone Encounter (Signed)
 Prolia  VOB initiated via [email or web address]  Next Prolia  inj DUE: 03/15/24

## 2024-02-13 NOTE — Telephone Encounter (Signed)
 Pt ready for scheduling for PROLIA  on or after : 03/15/24  Option# 1: Buy/Bill (Office supplied medication)  Out-of-pocket cost due at time of clinic visit: $372  Number of injection/visits approved: 2  Primary: [employer] MEDICARE Prolia  co-insurance: 20% Admin fee co-insurance: $40  Secondary: --- Prolia  co-insurance:  Admin fee co-insurance:   Medical Benefit Details: Date Benefits were checked: 02/13/24 Deductible: NO/ Coinsurance: 20%/ Admin Fee: $40  Prior Auth: APPROVED PA# [id number] Expiration Date: 08/25/23-08/24/24  # of doses approved: 2 ----------------------------------------------------------------------- Option# 2- Med Obtained from pharmacy:  Pharmacy benefit: Copay $737.78 (Paid to pharmacy) Admin Fee: $40 (Pay at clinic)  Prior Auth: N/A PA# Expiration Date:   # of doses approved:   If patient wants fill through the pharmacy benefit please send prescription to: [facility], and include estimated need by date in rx notes. Pharmacy will ship medication directly to the office.  Patient NOT eligible for Prolia  Copay Card. Copay Card can make patient's cost as little as $25. Link to apply: [email or web address]://[email or web address]  ** This summary of benefits is an estimation of the patient's out-of-pocket cost. Exact cost may very based on individual plan coverage.

## 2024-02-13 NOTE — Telephone Encounter (Signed)
 SABRA

## 2024-02-23 ENCOUNTER — Encounter: Payer: Self-pay | Admitting: Obstetrics

## 2024-02-23 ENCOUNTER — Ambulatory Visit: Admitting: Obstetrics

## 2024-02-23 ENCOUNTER — Other Ambulatory Visit (HOSPITAL_COMMUNITY)
Admission: RE | Admit: 2024-02-23 | Discharge: 2024-02-23 | Disposition: A | Discharge status: Home / Self Care | Attending: Obstetrics | Admitting: Obstetrics

## 2024-02-23 VITALS — BP 138/75 | HR 64 | Ht 60.24 in | Wt 139.4 lb

## 2024-02-23 DIAGNOSIS — Z9189 Other specified personal risk factors, not elsewhere classified: Secondary | ICD-10-CM

## 2024-02-23 DIAGNOSIS — R8290 Unspecified abnormal findings in urine: Secondary | ICD-10-CM

## 2024-02-23 DIAGNOSIS — Z87440 Personal history of urinary (tract) infections: Secondary | ICD-10-CM

## 2024-02-23 DIAGNOSIS — N8110 Cystocele, unspecified: Secondary | ICD-10-CM

## 2024-02-23 LAB — POCT URINALYSIS DIP (CLINITEK)
Bilirubin, UA: NEGATIVE
Glucose, UA: NEGATIVE mg/dL
Ketones, POC UA: NEGATIVE mg/dL
Leukocytes, UA: NEGATIVE
Nitrite, UA: NEGATIVE
POC PROTEIN,UA: NEGATIVE
Spec Grav, UA: 1.025 (ref 1.010–1.025)
Urobilinogen, UA: 0.2 U/dL
pH, UA: 6 (ref 5.0–8.0)

## 2024-02-23 LAB — URINALYSIS, ROUTINE W REFLEX MICROSCOPIC
Bilirubin Urine: NEGATIVE
Glucose, UA: NEGATIVE mg/dL
Hgb urine dipstick: NEGATIVE
Ketones, ur: NEGATIVE mg/dL
Leukocytes,Ua: NEGATIVE
Nitrite: NEGATIVE
Protein, ur: 30 mg/dL — AB
Specific Gravity, Urine: 1.021 (ref 1.005–1.030)
pH: 6 (ref 5.0–8.0)

## 2024-02-23 NOTE — Patient Instructions (Addendum)
 You have a stage 3 (out of 4) prolapse.  We discussed the fact that it is not life threatening but there are several treatment options. For treatment of pelvic organ prolapse, we discussed options for management including expectant management, conservative management, and surgical management, such as Kegels, a pessary, pelvic floor physical therapy, and specific surgical procedures.  Constipation: Our goal is to achieve formed bowel movements daily or every-other-day.  You may need to try different combinations of the following options to find what works best for you - everybody's body works differently so feel free to adjust the dosages as needed.  Some options to help maintain bowel health include:  Dietary changes (more leafy greens, vegetables and fruits; less processed foods) Fiber supplementation (Benefiber, FiberCon, Metamucil or Psyllium). Start slow and increase gradually to full dose. Over-the-counter agents such as: stool softeners (Docusate or Colace) and/or laxatives (Miralax, milk of magnesia)  Power Pudding is a natural mixture that may help your constipation.  To make blend 1 cup applesauce, 1 cup wheat bran, and 3/4 cup prune juice, refrigerate and then take 1 tablespoon daily with a large glass of water as needed.   Women should try to eat at least 21 to 25 grams of fiber a day, while men should aim for 30 to 38 grams a day. You can add fiber to your diet with food or a fiber supplement such as psyllium (metamucil), benefiber, or fibercon.   Here's a look at how much dietary fiber is found in some common foods. When buying packaged foods, check the Nutrition Facts label for fiber content. It can vary among brands.  Fruits Serving size Total fiber (grams)*  Raspberries 1 cup 8.0  Pear 1 medium 5.5  Apple, with skin 1 medium 4.5  Banana 1 medium 3.0  Orange 1 medium 3.0  Strawberries 1 cup 3.0   Vegetables Serving size Total fiber (grams)*  Green peas, boiled 1 cup 9.0   Broccoli, boiled 1 cup chopped 5.0  Turnip greens, boiled 1 cup 5.0  Brussels sprouts, boiled 1 cup 4.0  Potato, with skin, baked 1 medium 4.0  Sweet corn, boiled 1 cup 3.5  Cauliflower, raw 1 cup chopped 2.0  Carrot, raw 1 medium 1.5   Grains Serving size Total fiber (grams)*  Spaghetti, whole-wheat, cooked 1 cup 6.0  Barley, pearled, cooked 1 cup 6.0  Bran flakes 3/4 cup 5.5  Quinoa, cooked 1 cup 5.0  Oat bran muffin 1 medium 5.0  Oatmeal, instant, cooked 1 cup 5.0  Popcorn, air-popped 3 cups 3.5  Brown rice, cooked 1 cup 3.5  Bread, whole-wheat 1 slice 2.0  Bread, rye 1 slice 2.0   Legumes, nuts and seeds Serving size Total fiber (grams)*  Split peas, boiled 1 cup 16.0  Lentils, boiled 1 cup 15.5  Black beans, boiled 1 cup 15.0  Baked beans, canned 1 cup 10.0  Chia seeds 1 ounce 10.0  Almonds 1 ounce (23 nuts) 3.5  Pistachios 1 ounce (49 nuts) 3.0  Sunflower kernels 1 ounce 3.0  *Rounded to nearest 0.5 gram. Source: [employer] for [employer], [employer]	[place]    - discussed proper vulvar care, warm compression, avoid pad use, cotton only underwear and barrier ointment if needed  - encouraged Vit E suppository, moisturizer with Replens/Revaree

## 2024-02-23 NOTE — Assessment & Plan Note (Addendum)
-   PVR 0mL without signs and symptoms of urinary retention - For treatment of pelvic organ prolapse, we discussed options for management including expectant management, conservative management, and surgical management, such as Kegels, a pessary, pelvic floor physical therapy, and specific surgical procedures. - pt desires expectant mgmt at this time due to minimal symptoms - encouraged to start Kegel exercises with instructions reviewed and optimization of stool consistency

## 2024-02-23 NOTE — Assessment & Plan Note (Signed)
-   POCT UA + heme, pending UA microscopy and culture - symptoms since 68yo, denies UTI in the past year - For treatment of recurrent urinary tract infections, we discussed management of recurrent UTIs including prophylaxis with a daily low dose antibiotic, transvaginal estrogen therapy.  We discussed the role of diagnostic testing such as cystoscopy and upper tract imaging if clinical change - will postpone treatment or workup at this time due to lack of symptoms

## 2024-02-23 NOTE — Progress Notes (Signed)
 New Patient Evaluation and Consultation  Referring Provider: [name] LABOR, MD PCP: [name], MD Date of Service: 02/23/2024  SUBJECTIVE Chief Complaint: New Patient (Initial Visit) and new pateint  (Kristen Soto is a 68 y.o. female here today for  female organ prolapse. /)  History of Present Illness: Kristen Soto is a 68 y.o. White or Caucasian female seen in consultation at the request of Dr Herchel for evaluation of pelvic organ prolapse.    Vaginal bulge noted inside the vaginal opening during shower around 10/4023. Denies bleeding or discharge. Denies bother Grade II uterovaginal prolapse noted on exam by Dr. Leeroy in 11/08/23 History of recurrent UTI since 68yo, prior history of admission for kidney admission. Denies kidney stones, procedures. UTI symptoms with frequency/urgency, denies UTIs in the past year Cystoscopy 02/02/00 with mild urethral trigonitis due to recurrent UTI. 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   Day time voids 6.  Nocturia: 2 times per night to void. Denies bother Voiding dysfunction:  empties bladder well.  Patient does not use a catheter to empty bladder.  When urinating, patient feels she has no difficulties Drinks: 32oz water per day, 16oz hot tea  UTIs: 0 UTI's in the last year.   Denies history of blood in urine, kidney or bladder stones, bladder cancer, and kidney cancer No results found for the last 90 days.   Pelvic Organ Prolapse Symptoms:                  Patient Admits to a feeling of a bulge the vaginal area. It has been present for 4 months.  Patient Denies seeing a bulge.  This bulge is not bothersome.  Bowel Symptom: Bowel movements: 6-7 time(s) per week with history of  constipation Stool consistency: soft , Bristol III-V Straining: no.  Splinting: no.  Incomplete evacuation: no.  Patient Denies accidental bowel leakage / fecal incontinence Bowel regimen: dietimproves with blueberries, uses smooth move tea PRN 3x/year Last colonoscopy: 01/18/23 Results normal HM Colonoscopy          Current Care Gaps     COLON CANCER SCREENING ANNUAL FOBT (Yearly) Overdue since 02/17/2023    02/16/2022  Fecal Occult Blood, POC component of Fecal occult blood, imunochemical   Only the first 1 history entries have been loaded, but more history exists.             Upcoming     Colonoscopy (Every 10 Years) Next due on 01/17/2033    01/18/2023  COLONOSCOPY    Only the first 1 history entries have been loaded, but more history exists.                 Sexual Function Sexually active: yes.  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  Hyperlipidemia    Hypertension    Osteoporosis      Past Surgical History:   Past Surgical History:  Procedure Laterality Date   BASAL CELL CARCINOMA EXCISION  05/2008   removal, left eyelid   BREAST LUMPECTOMY     COLONOSCOPY WITH PROPOFOL  N/A 01/18/2023   WNL ([facility])   CYSTOSCOPY  02/02/2000   mild urethral trigonitis   ELBOW SURGERY Right 2019   FRACTURE SURGERY       Past OB/GYN History: OB History  Gravida Para Term Preterm AB Living  3 3 1 2  1   SAB IAB Ectopic Multiple Live Births      2    # Outcome Date GA  Lbr Len/2nd Weight Sex Type Anes PTL Lv  3 Term     M Vag-Spont   LIV  2 Preterm         DEC  1 Preterm         FD    Vaginal deliveries: 6lb15oz, episiotomy repaired. Forceps/ Vacuum deliveries: 0, Cesarean section: 0 Menopausal: Yes, at age 68yo, Denies vaginal bleeding since menopause Contraception: s/p menopause. Last pap smear.  Any history of abnormal pap smears: no. History of DES exposure    Component Value Date/Time   DIAGPAP  11/27/2020 1521    - Negative for intraepithelial lesion or malignancy (NILM)   DIAGPAP  08/17/2016 0000    NEGATIVE FOR INTRAEPITHELIAL LESIONS OR MALIGNANCY.   HPVHIGH Negative 11/27/2020 1521   ADEQPAP  11/27/2020 1521    Satisfactory for evaluation; transformation zone component PRESENT.   ADEQPAP  08/17/2016 0000    Satisfactory for evaluation  endocervical/transformation zone component PRESENT.    Medications: Patient has a current medication list which includes the following prescription(s): amlodipine , calcium -vitamin d , multivitamin, and valsartan , and the following Facility-Administered Medications: denosumab  and [START ON 03/15/2024] denosumab .   Allergies: Patient is allergic to hctz [hydrochlorothiazide ] and lisinopril .   Social History:  Social History   Tobacco Use   Smoking status: Former    Current packs/day: 0.00    Average packs/day: 0.5 packs/day for 17.0 years (8.5 ttl pk-yrs)    Types: Cigarettes    Quit date: 07/05/1990    Years since quitting: 33.6   Smokeless tobacco: Never  Vaping Use   Vaping status: Never Used  Substance Use Topics   Alcohol use: Yes    Alcohol/week: 2.0 standard drinks of alcohol    Types: 2 Glasses of wine per week    Comment: wine-occ   Drug use: No    Relationship status: married Patient lives with her spouse.   Patient is not employed. Regular exercise: Yes: 3 hours per week, walking and gym History of abuse: No  Family History:   Family History  Problem Relation Age of Onset    Alcohol abuse Mother    Hypertension Mother    Yvone' disease Mother    Heart attack Father 34   Hypertension Father    COPD Father    Heart disease Father    Hypertension Sister    Cancer Sister 41       breast   Cancer Maternal Grandmother 81       breast   Heart disease Maternal Grandfather        pacemaker   Kidney disease Paternal Grandmother        dialysis related   Hypertension Paternal Grandmother    Heart attack Paternal Grandfather 60   Heart disease Paternal Grandfather    Uterine  cancer Neg Hx    Renal cancer Neg Hx    Bladder Cancer Neg Hx      Review of Systems: Review of Systems  Constitutional:  Negative for fever, malaise/fatigue and weight loss.       Weight gain  Respiratory:  Negative for cough, shortness of breath and wheezing.   Cardiovascular:  Negative for chest pain, palpitations and leg swelling.  Gastrointestinal:  Negative for abdominal pain, blood in stool and constipation.  Genitourinary:  Positive for frequency (night time). Negative for dysuria, hematuria and urgency.       Bulge  Skin:  Negative for rash.  Neurological:  Negative for dizziness, weakness and headaches.  Endo/Heme/Allergies:  Does not bruise/bleed easily.  Psychiatric/Behavioral:  Negative for depression. The patient is not nervous/anxious.      OBJECTIVE Physical Exam: Vitals:   02/23/24 1302  BP: 138/75  Pulse: 64  Weight: 139 lb 6.4 oz (63.2 kg)  Height: 5' 0.24 (1.53 m)    Physical Exam Constitutional:      General: She is not in acute distress.    Appearance: Normal appearance.  Genitourinary:     Bladder and urethral meatus normal.     No lesions in the vagina.     Right Labia: No rash, tenderness, lesions, skin changes or Bartholin's cyst.    Left Labia: No tenderness, lesions, skin changes, Bartholin's cyst or rash.    No vaginal discharge, erythema, tenderness, bleeding, ulceration or granulation tissue.     Anterior, posterior and apical vaginal  prolapse present.    Moderate vaginal atrophy present.     Right Adnexa: not tender, not full and no mass present.    Left Adnexa: not tender, not full and no mass present.    No cervical motion tenderness, discharge, friability, lesion, polyp or nabothian cyst.     Uterus is prolapsed.     Uterus is not enlarged, fixed, tender or irregular.     No uterine mass detected.    Urethral meatus caruncle not present.    No urethral prolapse, tenderness, mass, hypermobility, discharge or stress urinary incontinence with cough stress test present.     Bladder is not tender, urgency on palpation not present and masses not present.      Pelvic Floor: Levator muscle strength is 4/5.    Levator ani not tender, obturator internus not tender, no asymmetrical contractions present and no pelvic spasms present.    Symmetrical pelvic sensation, anal wink present and BC reflex present. Cardiovascular:     Rate and Rhythm: Normal rate.  Pulmonary:     Effort: Pulmonary effort is normal. No respiratory distress.  Abdominal:     General: There is no distension.     Palpations: Abdomen is soft. There is no mass.     Tenderness: There is no abdominal tenderness.     Hernia: No hernia is present.  Neurological:     Mental Status: She is alert.  Vitals reviewed. Exam conducted with a chaperone present.      POP-Q:   POP-Q  2                                            Aa   2  Ba  2                                              C   2                                            Gh  3                                            Pb  7                                            tvl   -1                                            Ap  -1                                            Bp  0                                              D    Post-Void Residual (PVR) by Bladder Scan: In order to evaluate bladder emptying, we discussed obtaining a postvoid  residual and patient agreed to this procedure.  Procedure: The ultrasound unit was placed on the patient's abdomen in the suprapubic region after the patient had voided.    Post Void Residual - 02/23/24 1315       Post Void Residual   Post Void Residual 0 mL           Laboratory Results: Lab Results  Component Value Date   COLORU yellow 02/23/2024   CLARITYU clear 02/23/2024   GLUCOSEUR negative 02/23/2024   BILIRUBINUR NEGATIVE 02/23/2024   KETONESU negative 11/24/2022   SPECGRAV 1.025 02/23/2024   RBCUR trace-intact (A) 02/23/2024   PHUR 6.0 02/23/2024   PROTEINUR 30 (A) 02/23/2024   UROBILINOGEN 0.2 02/23/2024   LEUKOCYTESUR NEGATIVE 02/23/2024    Lab Results  Component Value Date   CREATININE 0.88 12/01/2023   CREATININE 0.72 09/06/2023   CREATININE 0.76 01/28/2023    No results found for: HGBA1C  Lab Results  Component Value Date   HGB 14.6 12/01/2023     ASSESSMENT AND PLAN Ms. Lamarre is a 68 y.o. with:  1. History of recurrent UTI (urinary tract infection)   2. Pelvic organ prolapse quantification stage 3 cystocele   3. Abnormal urinalysis   4. DES exposure in utero     History of recurrent UTI (urinary tract infection) Assessment & Plan: - POCT UA + heme, pending UA microscopy and culture - symptoms since 68yo, denies UTI in the past year -  For treatment of recurrent urinary tract infections, we discussed management of recurrent UTIs including prophylaxis with a daily low dose antibiotic, transvaginal estrogen therapy.  We discussed the role of diagnostic testing such as cystoscopy and upper tract imaging if clinical change - will postpone treatment or workup at this time due to lack of symptoms   Orders: -     POCT URINALYSIS DIP (CLINITEK)  Pelvic organ prolapse quantification stage 3 cystocele Assessment & Plan: - PVR 0mL without signs and symptoms of urinary retention - For treatment of pelvic organ prolapse, we discussed options for  management including expectant management, conservative management, and surgical management, such as Kegels, a pessary, pelvic floor physical therapy, and specific surgical procedures. - pt desires expectant mgmt at this time due to minimal symptoms - encouraged to start Kegel exercises with instructions reviewed and optimization of stool consistency   Abnormal urinalysis Assessment & Plan: - POCT UA + heme, pending UA microscopy and culture  - former tobacco use with 8.5 pack years  Orders: -     POCT URINALYSIS DIP (CLINITEK) -     Urine Microscopic; Future -     Urinalysis, Routine w reflex microscopic  DES exposure in utero Assessment & Plan: - NILM, neg HPV 11/27/20 - encouraged to return to GYN for pelvic exam and continue cervical cancer screening   Time spent: I spent 54 minutes dedicated to the care of this patient on the date of this encounter to include pre-visit review of records, face-to-face time with the patient discussing stage III pelvic organ prolapse, abnormal urinalysis, DES exposure, history of recurrent UTI, and post visit documentation and ordering medication/ testing.   Lianne ONEIDA Gillis, MD

## 2024-02-23 NOTE — Assessment & Plan Note (Signed)
-   NILM, neg HPV 11/27/20 - encouraged to return to GYN for pelvic exam and continue cervical cancer screening

## 2024-02-23 NOTE — Assessment & Plan Note (Addendum)
-   POCT UA + heme, pending UA microscopy and culture  - former tobacco use with 8.5 pack years

## 2024-02-25 ENCOUNTER — Encounter: Payer: Self-pay | Admitting: Obstetrics

## 2024-02-25 ENCOUNTER — Other Ambulatory Visit: Payer: Self-pay | Admitting: Family Medicine

## 2024-02-25 ENCOUNTER — Encounter: Payer: Self-pay | Admitting: Family Medicine

## 2024-02-25 DIAGNOSIS — I10 Essential (primary) hypertension: Secondary | ICD-10-CM

## 2024-03-02 ENCOUNTER — Telehealth: Payer: Self-pay | Admitting: Family Medicine

## 2024-03-02 ENCOUNTER — Other Ambulatory Visit

## 2024-03-02 DIAGNOSIS — I10 Essential (primary) hypertension: Secondary | ICD-10-CM | POA: Diagnosis not present

## 2024-03-02 LAB — MICROALBUMIN / CREATININE URINE RATIO
Creatinine,U: 127.3 mg/dL
Microalb Creat Ratio: 51 mg/g — ABNORMAL HIGH (ref 0.0–30.0)
Microalb, Ur: 6.5 mg/dL — ABNORMAL HIGH (ref 0.0–1.9)

## 2024-03-02 LAB — BASIC METABOLIC PANEL WITH GFR
BUN: 16 mg/dL (ref 6–23)
CO2: 29 meq/L (ref 19–32)
Calcium: 10.1 mg/dL (ref 8.4–10.5)
Chloride: 101 meq/L (ref 96–112)
Creatinine, Ser: 0.85 mg/dL (ref 0.40–1.20)
GFR: 70.92 mL/min (ref >60.00)
Glucose, Bld: 90 mg/dL (ref 70–99)
Potassium: 3.8 meq/L (ref 3.5–5.1)
Sodium: 139 meq/L (ref 135–145)

## 2024-03-02 NOTE — Telephone Encounter (Signed)
 Signed and in Amy's box.

## 2024-03-02 NOTE — Telephone Encounter (Signed)
 Completed form placed in file folder at front desk for patient pick up.  Patient is aware that form is ready and plans to pick it up at her appointment on 03/20/24.  Copy made for chart.

## 2024-03-02 NOTE — Telephone Encounter (Signed)
 Patient dropped off document medical clearance form, to be filled out by provider. Patient requested to send it back via Call Patient to pick up within 5-days. Document is located in providers tray at front office.Please advise at Mobile [phone] (mobile)

## 2024-03-02 NOTE — Telephone Encounter (Signed)
Placed in your box for review.  °

## 2024-03-03 ENCOUNTER — Ambulatory Visit: Payer: Self-pay | Admitting: Family Medicine

## 2024-03-03 DIAGNOSIS — R809 Proteinuria, unspecified: Secondary | ICD-10-CM | POA: Insufficient documentation

## 2024-03-20 ENCOUNTER — Ambulatory Visit (INDEPENDENT_AMBULATORY_CARE_PROVIDER_SITE_OTHER)

## 2024-03-20 DIAGNOSIS — M810 Age-related osteoporosis without current pathological fracture: Secondary | ICD-10-CM | POA: Diagnosis not present

## 2024-03-20 MED ORDER — DENOSUMAB 60 MG/ML ~~LOC~~ SOSY
60.0000 mg | PREFILLED_SYRINGE | Freq: Once | SUBCUTANEOUS | Status: AC
  Administered 2024-03-20: 60 mg via SUBCUTANEOUS

## 2024-03-20 MED ORDER — DENOSUMAB 60 MG/ML ~~LOC~~ SOSY: 60.0000 mg | PREFILLED_SYRINGE | Freq: Once | SUBCUTANEOUS | Status: AC

## 2024-03-20 NOTE — Progress Notes (Signed)
 Per orders of Dr. Eustaquio Boyden, injection of prolia 60 mg [place] given by Lewanda Rife in left arm. Patient tolerated injection well. Patient will make appointment for 6 month.

## 2024-04-01 ENCOUNTER — Emergency Department

## 2024-04-01 ENCOUNTER — Other Ambulatory Visit: Payer: Self-pay

## 2024-04-01 ENCOUNTER — Emergency Department
Admission: EM | Admit: 2024-04-01 | Discharge: 2024-04-02 | Disposition: A | Discharge status: Home / Self Care | Attending: Emergency Medicine | Admitting: Emergency Medicine

## 2024-04-01 DIAGNOSIS — I10 Essential (primary) hypertension: Secondary | ICD-10-CM | POA: Diagnosis not present

## 2024-04-01 DIAGNOSIS — R4182 Altered mental status, unspecified: Secondary | ICD-10-CM | POA: Diagnosis present

## 2024-04-01 DIAGNOSIS — G454 Transient global amnesia: Secondary | ICD-10-CM | POA: Diagnosis not present

## 2024-04-01 LAB — CBC WITH DIFFERENTIAL/PLATELET
Abs Immature Granulocytes: 0.01 K/uL (ref 0.00–0.07)
Basophils Absolute: 0.1 K/uL (ref 0.0–0.1)
Basophils Relative: 1 %
Eosinophils Absolute: 0.1 K/uL (ref 0.0–0.5)
Eosinophils Relative: 1 %
HCT: 41.9 % (ref 36.0–46.0)
Hemoglobin: 14.7 g/dL (ref 12.0–15.0)
Immature Granulocytes: 0 %
Lymphocytes Relative: 37 %
Lymphs Abs: 2.6 K/uL (ref 0.7–4.0)
MCH: 30.9 pg (ref 26.0–34.0)
MCHC: 35.1 g/dL (ref 30.0–36.0)
MCV: 88 fL (ref 80.0–100.0)
Monocytes Absolute: 0.6 K/uL (ref 0.1–1.0)
Monocytes Relative: 8 %
Neutro Abs: 3.8 K/uL (ref 1.7–7.7)
Neutrophils Relative %: 53 %
Platelets: 243 K/uL (ref 150–400)
RBC: 4.76 MIL/uL (ref 3.87–5.11)
RDW: 12.1 % (ref 11.5–15.5)
WBC: 7.2 K/uL (ref 4.0–10.5)
nRBC: 0 % (ref 0.0–0.2)

## 2024-04-01 LAB — COMPREHENSIVE METABOLIC PANEL WITH GFR
ALT: 25 U/L (ref 0–44)
AST: 26 U/L (ref 15–41)
Albumin: 4.8 g/dL (ref 3.5–5.0)
Alkaline Phosphatase: 52 U/L (ref 38–126)
Anion gap: 11 (ref 5–15)
BUN: 15 mg/dL (ref 8–23)
CO2: 22 mmol/L (ref 22–32)
Calcium: 9.7 mg/dL (ref 8.9–10.3)
Chloride: 105 mmol/L (ref 98–111)
Creatinine, Ser: 0.73 mg/dL (ref 0.44–1.00)
GFR, Estimated: >60 mL/min (ref >60)
Glucose, Bld: 101 mg/dL — ABNORMAL HIGH (ref 70–99)
Potassium: 3.6 mmol/L (ref 3.5–5.1)
Sodium: 138 mmol/L (ref 135–145)
Total Bilirubin: 0.8 mg/dL (ref 0.0–1.2)
Total Protein: 7.9 g/dL (ref 6.5–8.1)

## 2024-04-01 LAB — URINALYSIS, ROUTINE W REFLEX MICROSCOPIC
Bilirubin Urine: NEGATIVE
Glucose, UA: NEGATIVE mg/dL
Hgb urine dipstick: NEGATIVE
Ketones, ur: NEGATIVE mg/dL
Leukocytes,Ua: NEGATIVE
Nitrite: NEGATIVE
Protein, ur: NEGATIVE mg/dL
Specific Gravity, Urine: 1.004 — ABNORMAL LOW (ref 1.005–1.030)
pH: 6 (ref 5.0–8.0)

## 2024-04-01 MED ORDER — ASPIRIN 81 MG PO CHEW
324.0000 mg | CHEWABLE_TABLET | Freq: Once | ORAL | Status: AC
  Administered 2024-04-01: 324 mg via ORAL
  Filled 2024-04-01: qty 4

## 2024-04-01 MED ORDER — GADOBUTROL 1 MMOL/ML IV SOLN
6.0000 mL | Freq: Once | INTRAVENOUS | Status: AC | PRN
Start: 2024-04-01 — End: 2024-04-01
  Administered 2024-04-01: 6 mL via INTRAVENOUS

## 2024-04-01 NOTE — ED Notes (Signed)
 Assisting the tele neurologist with stroke exam in room

## 2024-04-01 NOTE — Consult Note (Signed)
 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 Her symptoms resolved, and her neurologic exam was unremarkable with intact immediate and delayed recall. Overall, her presentation is most concerning for transient global amnesia. This is typically a benign and isolated transient condition that does not cause any long-term consequences. Recommend MRI brain w/wo contrast to rule out any secondary causes of TGA and transient encephalopathy. Of note, TGA can sometimes cause punctate diffusion restriction in either hippocampus (within medial temporal lobe), which is further supportive of this diagnosis if present.   Recommendations: Our recommendations are outlined below.  Provided reassurance regarding excellent prognosis of suspected TGA diagnosis Please obtain MRI brain w/wo contrast to rule out other acute CNS etiologies (including seizure focus, CNS inflammatory process, etc.) If MRI brain is unremarkable or demonstrates findings suggestive of TGA (including punctate diffusion restriction in either hippocampus), no further neurologic workup will likely be necessary. In this case, may start discharge planning from neurologic perspective pending medical clearance  Neurology may follow if admitted.  ----------------------------------------------------------------------------------------------------  Metrics: Dispatch Time: 04/01/2024 21:14:56 Callback Response Time: 04/01/2024 21:19:18  Primary Provider Notified of Diagnostic Impression and Management Plan on: 04/01/2024 22:10:21    Imaging CT head reviewed without any acute  process   ----------------------------------------------------------------------------------------------------  Chief Complaint: transient altered mental status and memory loss  History of Present Illness: Patient is a 68 year old Female. Patient was last known normal around 5 PM this evening. Later around 7 PM, she experienced an episode of confusion and disorientation with memory loss. Family reported that she was having trouble recalling events from earlier that day and yesterday, was disoriented to the month/year and current president, and was asking the same questions over and over again. Patient and family denied any significant headache, loss of speech, vision changes, tremors/convulsions, loss of consciousness, head injury, or focal numbness/weakness at the time. Symptoms lasted about 30 minutes before self-resolving, and she reported feeling back to her neurologic baseline at the time of my evaluation. She denied any history of similar symptoms.    Past Medical History:      Hypertension      Hyperlipidemia  Medications:  No Anticoagulant use  No Antiplatelet use Reviewed EMR for current medications  Allergies:  Reviewed Description: HCTZ, lisinopril   Social History: Smoking: Former  Family History:  There is no family history of premature cerebrovascular disease pertinent to this consultation  ROS : 14 Points Review of Systems was performed and was negative except mentioned in HPI.  Past Surgical History: There Is No Surgical History Contributory To Today's Visit   Examination: BP(169/92), Pulse(74), 1A: Level of Consciousness - Alert; keenly responsive + 0 1B: Ask Month and Age - Both Questions Right + 0 1C: Blink Eyes & Squeeze Hands - Performs Both Tasks + 0 2: Test Horizontal Extraocular Movements - Normal + 0 3: Test Visual Fields - No Visual Loss + 0 4: Test Facial Palsy (Use Grimace if Obtunded) - Normal symmetry + 0 5A: Test Left Arm Motor Drift -  No Drift for 10 Seconds + 0 5B: Test Right Arm Motor Drift - No Drift for 10 Seconds + 0 6A: Test Left  Leg Motor Drift - No Drift for 5 Seconds + 0 6B: Test Right Leg Motor Drift - No Drift for 5 Seconds + 0 7: Test Limb Ataxia (FNF/Heel-Shin) - No Ataxia + 0 8: Test Sensation - Normal; No sensory loss + 0 9: Test Language/Aphasia - Normal; No aphasia + 0 10: Test Dysarthria - Normal + 0 11: Test Extinction/Inattention - No abnormality + 0  NIHSS Score: 0 NIHSS Free Text : Immediate recall 3/3, delayed recall 3/3  Spoke with : Dr. Dicky  This consult was conducted in real time using interactive audio and Immunologist. Patient was informed of the technology being used for this visit and agreed to proceed. Patient located in hospital and provider located at home/office setting.  Patient is being evaluated for possible acute neurologic impairment and high probability of imminent or life - threatening deterioration.I spent total of 35 minutes providing care to this patient, including time for face to face visit via telemedicine, review of medical records, imaging studies and discussion of findings with providers, the patient and / or family.  Dr Eva De'Prey  [facility] For Inpatient follow-up with [facility] physician please call [facility] at [phone]. As we are not an outpatient service for any post hospital discharge needs please contact the hospital for assistance.  If you have any questions for the [facility] physicians or need to reconsult for clinical or diagnostic changes please contact us  via [facility] at [phone].

## 2024-04-01 NOTE — ED Notes (Signed)
 EKG given to Dr. Dicky.

## 2024-04-01 NOTE — ED Triage Notes (Signed)
 Pt reports aprox 1 hour ago she had a 30 minute episode of memory loss after drinking some wine. Per husband pt came to get him and pt reports she couldn't remember where the food they had for dinner came from and pt couldn't remember the year or who the president was. Pt reports she is at her baseline now, pt a&o x4. Per husband pt never had slurred speech or weakness in extremities.

## 2024-04-02 NOTE — ED Provider Notes (Signed)
 MRI without any acute findings. Will discharge with paperwork prepared by Dr. Dicky.   Floy Roberts, MD 04/02/24 [phone]

## 2024-04-02 NOTE — ED Provider Notes (Signed)
 [facility] Provider Note    Event Date/Time   First MD Initiated Contact with Patient 04/01/24 2053     (approximate)   History   Altered Mental Status   HPI  Kristen Soto is a 68 y.o. female   history of hypertension hyperlipidemia  She and husband ride home tonight normal self.  They had pizza each had 1 glass of wine not unusual for them.  Husband took rest at about 5 PM when he got up.  Noted that patient was confused she was asking questions about where she was, why there was pizza in the kitchen, and seem like she had no memory of what it just happened over the last hour or so.  He drove her in the car and when they got here it seemed like her memory went back to normal.  She did not have any trouble walking no weakness she recognized hand but did not recognize immediately what was going on around her or why there was pizza in the kitchen.  She did not slur her speech stumble or fall  Nothing like this ever happen before she never had any memory issues  Patient herself reports no pain or discomfort does not really recall exactly what happened but feels perfectly well now tells me a year date day and reports she does not really know what briefly happened      Physical Exam   Triage Vital Signs: ED Triage Vitals  Encounter Vitals Group     BP 04/01/24 2024 (!) 169/92     Girls Systolic BP Percentile --      Girls Diastolic BP Percentile --      Boys Systolic BP Percentile --      Boys Diastolic BP Percentile --      Pulse Rate 04/01/24 2024 74     Resp 04/01/24 2024 17     Temp 04/01/24 2024 98.6 F (37 C)     Temp Source 04/01/24 2024 Oral     SpO2 04/01/24 2024 97 %     Weight 04/01/24 2023 136 lb (61.7 kg)     Height 04/01/24 2023 5' 1 (1.549 m)     Head Circumference --      Peak Flow --      Pain Score 04/01/24 2023 0     Pain Loc --      Pain Education --      Exclude from Growth Chart --     Most recent vital  signs: Vitals:   04/01/24 2024 04/01/24 2056  BP: (!) 169/92   Pulse: 74   Resp: 17   Temp: 98.6 F (37 C)   SpO2: 97% 99%     General: Awake, no distress.  Very pleasant normocephalic atraumatic moves all extremities.  No pronator drift speech clear no facial droop. CV:  Good peripheral perfusion.  Normal tones and rate Resp:  Normal effort.  Clear bilateral Abd:  No distention.  Soft nontender Other:     She and her husband report she has been in her normal health no pain no discomfort no recent illnesses   ED Results / Procedures / Treatments   Labs (all labs ordered are listed, but only abnormal results are displayed) Labs Reviewed  COMPREHENSIVE METABOLIC PANEL WITH GFR - Abnormal; Notable for the following components:      Result Value   Glucose, Bld 101 (*)    All other components within normal limits  URINALYSIS, ROUTINE  W REFLEX MICROSCOPIC - Abnormal; Notable for the following components:   Color, Urine STRAW (*)    APPearance CLEAR (*)    Specific Gravity, Urine 1.004 (*)    All other components within normal limits  CBC WITH DIFFERENTIAL/PLATELET  ETHANOL     EKG  Inter by me at 2110 heart rate 70 QRS 120 QTc 480 normal sinus rhythm no evidence acute ischemia   RADIOLOGY  CT head interpreted by me as grossly negative for acute hemorrhage  CT Head Wo Contrast Result Date: 04/01/2024 CLINICAL DATA:  30 minute episode of memory loss. EXAM: CT HEAD WITHOUT CONTRAST TECHNIQUE: Contiguous axial images were obtained from the base of the skull through the vertex without intravenous contrast. RADIATION DOSE REDUCTION: This exam was performed according to the departmental dose-optimization program which includes automated exposure control, adjustment of the mA and/or kV according to patient size and/or use of iterative reconstruction technique. COMPARISON:  None Available. FINDINGS: Brain: No evidence of acute infarction, hemorrhage, hydrocephalus, extra-axial  collection or mass lesion/mass effect. Vascular: No hyperdense vessel or unexpected calcification. Skull: Normal. Negative for fracture or focal lesion. Sinuses/Orbits: No acute finding. Other: None. IMPRESSION: No acute intracranial pathology. Electronically Signed   By: Suzen Dials M.D.   On: 04/01/2024 21:02    MRI brain pending   PROCEDURES:  Critical Care performed: No  Procedures   MEDICATIONS ORDERED IN ED: Medications  aspirin chewable tablet 324 mg (324 mg Oral Given 04/01/24 2132)  gadobutrol (GADAVIST) 1 MMOL/ML injection 6 mL (6 mLs Intravenous Contrast Given 04/01/24 2306)     IMPRESSION / MDM / ASSESSMENT AND PLAN / ED COURSE  I reviewed the triage vital signs and the nursing notes.                              Differential diagnosis includes, but is not limited to, acute amnestic event, episode of disorientation, TIA stroke electrolyte abnormality infection etc.  Workup in the ER very reassuring urinalysis negative patient reports no preceding illness no chest pain no acute cardiopulmonary symptoms, normal well oriented now pleasant ambulatory without distress.  Normal CBC and metabolic panel  Patient's presentation is most consistent with acute complicated illness / injury requiring diagnostic workup.   Findings seem to be potentially consistent with acute amnestic event now resolved   Clinical Course as of 04/02/24 0008  Sun Apr 01, 2024  2123 Background history, clinical presenting symptoms workup to this point discussed with telemetry neurology specialist RN.  Full physician consult to follow.  Consult placed for concerns of potential acute amnestic type event. [MQ]    Clinical Course User Index [MQ] Dicky Anes, MD   Consulted with neurology.  Appreciate consult, ongoing care signed to Dr. Floy with plan to follow-up on MRI of the brain  FINAL CLINICAL IMPRESSION(S) / ED DIAGNOSES   Final diagnoses:  TGA (transient global amnesia)     Rx /  DC Orders   ED Discharge Orders     None        Note:  This document was prepared using Dragon voice recognition software and may include unintentional dictation errors.   Dicky Anes, MD 04/02/24 [phone]

## 2024-04-03 ENCOUNTER — Encounter: Payer: Self-pay | Admitting: Family Medicine

## 2024-04-03 NOTE — Telephone Encounter (Signed)
 Spoke with pt scheduling ER f/u on 04/13/24 at 11:30 due to pt currently at beach. Asks that Dr KANDICE let her know if she really needs this f/u or not.

## 2024-04-13 ENCOUNTER — Ambulatory Visit: Admitting: Family Medicine

## 2024-04-13 ENCOUNTER — Encounter: Payer: Self-pay | Admitting: Family Medicine

## 2024-04-13 VITALS — BP 117/82 | HR 63 | Temp 98.3°F | Ht 61.0 in | Wt 138.4 lb

## 2024-04-13 DIAGNOSIS — R413 Other amnesia: Secondary | ICD-10-CM | POA: Insufficient documentation

## 2024-04-13 DIAGNOSIS — G454 Transient global amnesia: Secondary | ICD-10-CM

## 2024-04-13 DIAGNOSIS — R001 Bradycardia, unspecified: Secondary | ICD-10-CM | POA: Diagnosis not present

## 2024-04-13 LAB — VITAMIN B12: Vitamin B-12: 833 pg/mL (ref 211–911)

## 2024-04-13 NOTE — Patient Instructions (Addendum)
 EKG today  Vitamin B12 check today  Let us  know if ongoing or recurrent memory difficulty.

## 2024-04-13 NOTE — Progress Notes (Unsigned)
 Ph: [phone] Fax: [phone]   Patient ID: Kristen Soto, female    DOB: 01-31-1956, 68 y.o.   MRN: [id number]  This visit was conducted in person.  BP 117/82   Pulse 63   Temp 98.3 F (36.8 C) (Oral)   Ht 5' 1 (1.549 m)   Wt 138 lb 6 oz (62.8 kg)   LMP 10/29/2010   SpO2 98%   BMI 26.15 kg/m   Pulse on recheck 56  CC: ER f/u visit  Subjective:   HPI: Kristen Soto is a 68 y.o. female presenting on 04/13/2024 for Follow-up (Pt Is here for a ER f/u  /Pt was seen for TGA at Hospital. Pt is still having slight memory issues.//)   Recent ER visit for transient global amnesia.  This happened right after high intensity workout at gym followed by intercourse. Didn't remember where pizza leftovers came from, forgot she had a cat, forgot she was planning trip to beach over weekend.  Since then she notes trouble remembering names ie grandson Duwaine.   Workup reviewed including:  Head CT reassuringly normal. Brain MRI also reassuring.  Labwork unrevealing including urinalysis.  Lab Results  Component Value Date   TSH 1.55 12/01/2023  EKG reassuring, mention of possible LAE.  EKG was abnormal, BP was elevated at that time- will repeat today.  Lab Results  Component Value Date   VITAMINB12 833 04/13/2024    No results found for: RPR   No h/o migraines or seizures.   She did see neurologist in consult at ER - agreed with TGA dx.      Relevant past medical, surgical, family and social history reviewed and updated as indicated. Interim medical history since our last visit reviewed. Allergies and medications reviewed and updated. Outpatient Medications Prior to Visit  Medication Sig Dispense Refill   amLODipine  (NORVASC ) 5 MG tablet Take 1 tablet (5 mg total) by mouth daily. 90 tablet 4   Calcium  Carb-Cholecalciferol (CALCIUM -VITAMIN D ) 600-400 MG-UNIT TABS Take 1 tablet by mouth daily. 60 tablet    Multiple Vitamin (MULTIVITAMIN) tablet Take 1 tablet by mouth  daily.     valsartan  (DIOVAN ) 160 MG tablet Take 1 tablet (160 mg total) by mouth daily. 90 tablet 4   Facility-Administered Medications Prior to Visit  Medication Dose Route Frequency Provider Last Rate Last Admin   denosumab  (PROLIA ) injection 60 mg  60 mg Subcutaneous Once Deriyah Kunath, MD       denosumab  (PROLIA ) injection 60 mg  60 mg Subcutaneous Once Boston Catarino, MD       [START ON 09/17/2024] denosumab  (PROLIA ) injection 60 mg  60 mg Subcutaneous Once [name], MD         Per HPI unless specifically indicated in ROS section below Review of Systems  Objective:  BP 117/82   Pulse 63   Temp 98.3 F (36.8 C) (Oral)   Ht 5' 1 (1.549 m)   Wt 138 lb 6 oz (62.8 kg)   LMP 10/29/2010   SpO2 98%   BMI 26.15 kg/m   Wt Readings from Last 3 Encounters:  04/13/24 138 lb 6 oz (62.8 kg)  04/01/24 136 lb (61.7 kg)  02/23/24 139 lb 6.4 oz (63.2 kg)      Physical Exam Vitals and nursing note reviewed.  Constitutional:      Appearance: Normal appearance. She is not ill-appearing.  HENT:     Head: Normocephalic and atraumatic.     Mouth/Throat:  Mouth: Mucous membranes are moist.     Pharynx: Oropharynx is clear. No oropharyngeal exudate or posterior oropharyngeal erythema.  Eyes:     Extraocular Movements: Extraocular movements intact.     Conjunctiva/sclera: Conjunctivae normal.     Pupils: Pupils are equal, round, and reactive to light.  Neck:     Thyroid : No thyroid  mass or thyromegaly.     Vascular: No carotid bruit.  Cardiovascular:     Rate and Rhythm: Normal rate and regular rhythm.     Pulses: Normal pulses.     Heart sounds: Normal heart sounds. No murmur heard. Pulmonary:     Effort: Pulmonary effort is normal. No respiratory distress.     Breath sounds: Normal breath sounds. No wheezing, rhonchi or rales.  Musculoskeletal:     Cervical back: Normal range of motion and neck supple.     Right lower leg: No edema.     Left lower leg: No edema.   Lymphadenopathy:     Cervical: No cervical adenopathy.  Skin:    General: Skin is warm and dry.     Findings: No rash.  Neurological:     General: No focal deficit present.     Mental Status: She is alert.     Cranial Nerves: Cranial nerves 2-12 are intact.     Sensory: Sensation is intact.     Motor: Motor function is intact.     Coordination: Coordination is intact. Romberg sign negative.     Gait: Gait is intact.     Comments:  CN 2-12 intact FTN Intact EOMI No pronator drift  Psychiatric:        Mood and Affect: Mood normal.        Behavior: Behavior normal.       Results for orders placed or performed in visit on 04/13/24  Vitamin B12   Collection Time: 04/13/24 12:59 PM  Result Value Ref Range   Vitamin B-12 833 211 - 911 pg/mL   EKG - sinus bradycardia rate 40s, normal axis, intervals, no acute ST/T changes, no P wave changes  Assessment & Plan:   Problem List Items Addressed This Visit     TGA (transient global amnesia) - Primary   Reviewed diagnosis with patient and reassuring work up in ER.  Overall reassurance provided. Will continue to monitor for new or worsening neurological symptoms      Relevant Orders   Vitamin B12 (Completed)   Memory difficulty   Notes some persistence of memory difficulty. Will update vitamin B12 levels - she is on daily MVI. Consider formal memory eval if ongoing.       Sinus bradycardia   Abnormal EKG noted at ER - with possible LAE in setting of hypertension. Will repeat today - she continues amlodipine  and valsartan  with normal blood pressures in office. EKG showing marked bradycardia however she is asymptomatic from this standpoint. Discussed monitoring pulse rate at home and letting me know if staying in 40s or if she develops any cardiac symptoms.         No orders of the defined types were placed in this encounter.   Orders Placed This Encounter  Procedures   Vitamin B12    Patient Instructions  EKG  today  Vitamin B12 check today  Let us  know if ongoing or recurrent memory difficulty.   Follow up plan: No follow-ups on file.  Anton Blas, MD

## 2024-04-14 ENCOUNTER — Ambulatory Visit: Payer: Self-pay | Admitting: Family Medicine

## 2024-04-16 ENCOUNTER — Encounter: Payer: Self-pay | Admitting: Family Medicine

## 2024-04-16 DIAGNOSIS — R001 Bradycardia, unspecified: Secondary | ICD-10-CM | POA: Insufficient documentation

## 2024-04-16 NOTE — Assessment & Plan Note (Signed)
 Abnormal EKG noted at ER - with possible LAE in setting of hypertension. Will repeat today - she continues amlodipine  and valsartan  with normal blood pressures in office. EKG showing marked bradycardia however she is asymptomatic from this standpoint. Discussed monitoring pulse rate at home and letting me know if staying in 40s or if she develops any cardiac symptoms.

## 2024-04-16 NOTE — Assessment & Plan Note (Signed)
 Notes some persistence of memory difficulty. Will update vitamin B12 levels - she is on daily MVI. Consider formal memory eval if ongoing.

## 2024-04-16 NOTE — Assessment & Plan Note (Signed)
 Reviewed diagnosis with patient and reassuring work up in ER.  Overall reassurance provided. Will continue to monitor for new or worsening neurological symptoms

## 2024-06-12 ENCOUNTER — Ambulatory Visit: Admitting: Family Medicine

## 2024-06-12 ENCOUNTER — Encounter: Payer: Self-pay | Admitting: Family Medicine

## 2024-06-12 VITALS — BP 120/78 | HR 91 | Temp 100.6°F | Ht 61.0 in | Wt 135.0 lb

## 2024-06-12 DIAGNOSIS — R509 Fever, unspecified: Secondary | ICD-10-CM

## 2024-06-12 LAB — POCT INFLUENZA A/B: Influenza A, POC: NEGATIVE

## 2024-06-12 LAB — POCT RAPID STREP A (OFFICE): Rapid Strep A Screen: POSITIVE — AB

## 2024-06-12 LAB — POC COVID19 BINAXNOW: SARS Coronavirus 2 Ag: NEGATIVE

## 2024-06-12 MED ORDER — AMOXICILLIN 875 MG PO TABS: 875.0000 mg | ORAL_TABLET | Freq: Two times a day (BID) | ORAL | 0 refills | Status: DC

## 2024-06-12 NOTE — Patient Instructions (Signed)
Rest, fluids, amoxil.   Update [place] as needed.  Take care.  Glad to see you.

## 2024-06-12 NOTE — Progress Notes (Unsigned)
 Has had a fever, cough, and sore throat. X 4 days.  Sx started with chills.  Then fever.  Some sputum.  Cna still get a deep breath.  No vomiting, no diarrhea.  Dec appetite.  Still drinking.  Still taking liquids with UOP.  Rare wheeze.  Sputum was brown and thin.    Strep covid and flu tests done at [facility].   She feels some better today.  Sinus pressure resolved.   Husband also sick at home.   Meds, vitals, and allergies reviewed.   ROS: Per HPI unless specifically indicated in ROS section   GEN: nad, alert and oriented HEENT: mucous membranes moist, tm w/o erythema, nasal exam w/o erythema, clear discharge noted,  OP with mild cobblestoning, sinuses not ttp NECK: supple w/o LA CV: rrr.   PULM: ctab, no inc wob EXT: no edema SKIN: well perfused.   Strep test positive.  D/w pt at [facility].  Other tests neg.

## 2024-06-13 DIAGNOSIS — J020 Streptococcal pharyngitis: Secondary | ICD-10-CM | POA: Insufficient documentation

## 2024-06-13 NOTE — Assessment & Plan Note (Signed)
 Rest, fluids, amoxil .  Update us  as needed.  Okay for outpatient follow-up. Supportive care otherwise.

## 2024-06-15 ENCOUNTER — Telehealth: Payer: Self-pay | Admitting: Family Medicine

## 2024-06-15 ENCOUNTER — Encounter: Payer: Self-pay | Admitting: Family Medicine

## 2024-06-15 MED ORDER — AZITHROMYCIN 250 MG PO TABS: ORAL_TABLET | ORAL | 0 refills | Status: AC

## 2024-06-15 NOTE — Telephone Encounter (Signed)
 Relayed message to patient. Patient is uploading a picture to Mychart. Patients would like to know if it's ok for her to attend a Family event Sunday 06/17/24.

## 2024-06-15 NOTE — Telephone Encounter (Signed)
 Called patient reviewed all information and repeated back to me. Will call if any questions.  She also wanted to let you know picture sent.

## 2024-06-15 NOTE — Telephone Encounter (Signed)
 D/w Dr. ARLOA.   Would stop amoxil  and change to zithromax.  Can she send a picture of the rash?  And would get rechecked if any progressive sx.    Thanks.

## 2024-06-15 NOTE — Telephone Encounter (Signed)
 Noted. Spoke with Dr Cleatus. See mychart message

## 2024-06-15 NOTE — Telephone Encounter (Signed)
 Agree with this. Thanks.

## 2024-06-15 NOTE — Telephone Encounter (Signed)
 See my chart message

## 2024-06-15 NOTE — Telephone Encounter (Signed)
 I don't see photos of skin rash in chart yet.  As long as she's feeling better with improving cough and afebrile x24 hours, I think ok to attend family event on Sunday.

## 2024-06-15 NOTE — Telephone Encounter (Signed)
 Please check with patient about this rash.  Unclear if this is from her illness or from amoxil .  I would stop amoxil  in the meantime.  I routed this to PCP as FYI.    I think she needs to get rechecked if she has any progressive symptoms.  Thanks.

## 2024-06-18 NOTE — Telephone Encounter (Signed)
 Left message to return call to office. Ok to [employer] below.

## 2024-06-19 NOTE — Telephone Encounter (Unsigned)
 Copied from CRM #[id number]. Topic: General - Other >> Jun 18, 2024  3:36 PM Deleta S wrote: Reason for CRM: returning call from nurse

## 2024-06-20 NOTE — Telephone Encounter (Signed)
 Called patient verified that she received message. No further questions at this time.

## 2024-07-16 ENCOUNTER — Encounter: Payer: Self-pay | Admitting: *Deleted

## 2025-01-10 ENCOUNTER — Other Ambulatory Visit

## 2025-01-15 ENCOUNTER — Encounter: Admitting: Family Medicine
